# Patient Record
Sex: Male | Born: 1960 | Race: White | Hispanic: No | Marital: Married | Smoking: Former smoker
Health system: Southern US, Community
[De-identification: ages and names within clinical notes are randomized; demographics above are authoritative.]

## PROBLEM LIST (undated history)

## (undated) DIAGNOSIS — I5189 Other ill-defined heart diseases: Secondary | ICD-10-CM

## (undated) DIAGNOSIS — E785 Hyperlipidemia, unspecified: Secondary | ICD-10-CM

## (undated) DIAGNOSIS — Z720 Tobacco use: Secondary | ICD-10-CM

## (undated) DIAGNOSIS — I10 Essential (primary) hypertension: Secondary | ICD-10-CM

## (undated) DIAGNOSIS — I2510 Atherosclerotic heart disease of native coronary artery without angina pectoris: Secondary | ICD-10-CM

## (undated) HISTORY — DX: Hyperlipidemia, unspecified: E78.5

## (undated) HISTORY — DX: Tobacco use: Z72.0

## (undated) HISTORY — DX: Other ill-defined heart diseases: I51.89

## (undated) HISTORY — PX: HERNIA REPAIR: SHX51

## (undated) HISTORY — DX: Atherosclerotic heart disease of native coronary artery without angina pectoris: I25.10

---

## 2009-05-30 ENCOUNTER — Emergency Department: Payer: Self-pay | Admitting: Emergency Medicine

## 2009-07-10 ENCOUNTER — Ambulatory Visit: Payer: Self-pay | Admitting: Surgery

## 2009-07-12 ENCOUNTER — Ambulatory Visit: Payer: Self-pay | Admitting: Surgery

## 2011-05-18 ENCOUNTER — Emergency Department: Payer: Self-pay | Admitting: Emergency Medicine

## 2014-10-17 DIAGNOSIS — E669 Obesity, unspecified: Secondary | ICD-10-CM | POA: Insufficient documentation

## 2014-10-17 DIAGNOSIS — Z833 Family history of diabetes mellitus: Secondary | ICD-10-CM | POA: Insufficient documentation

## 2014-10-17 DIAGNOSIS — Z6379 Other stressful life events affecting family and household: Secondary | ICD-10-CM | POA: Insufficient documentation

## 2014-10-17 DIAGNOSIS — F17200 Nicotine dependence, unspecified, uncomplicated: Secondary | ICD-10-CM | POA: Insufficient documentation

## 2014-10-17 DIAGNOSIS — I10 Essential (primary) hypertension: Secondary | ICD-10-CM | POA: Insufficient documentation

## 2014-10-24 DIAGNOSIS — R7302 Impaired glucose tolerance (oral): Secondary | ICD-10-CM | POA: Insufficient documentation

## 2015-09-23 ENCOUNTER — Encounter: Payer: Self-pay | Admitting: Emergency Medicine

## 2015-09-23 ENCOUNTER — Emergency Department: Payer: Medicaid Other

## 2015-09-23 ENCOUNTER — Inpatient Hospital Stay (HOSPITAL_COMMUNITY)
Admission: EM | Admit: 2015-09-23 | Discharge: 2015-09-24 | DRG: 247 | Disposition: A | Discharge status: Home / Self Care | Payer: Medicaid Other | Source: Other Acute Inpatient Hospital | Attending: Cardiovascular Disease | Admitting: Cardiovascular Disease

## 2015-09-23 ENCOUNTER — Encounter (HOSPITAL_COMMUNITY): Payer: Self-pay | Admitting: *Deleted

## 2015-09-23 ENCOUNTER — Encounter (HOSPITAL_COMMUNITY)
Admission: EM | Disposition: A | Discharge status: Home / Self Care | Payer: Self-pay | Source: Other Acute Inpatient Hospital | Attending: Cardiovascular Disease

## 2015-09-23 ENCOUNTER — Emergency Department
Admission: EM | Admit: 2015-09-23 | Discharge: 2015-09-23 | Disposition: A | Discharge status: Other Acute Inpatient Hospital | Payer: Medicaid Other | Attending: Emergency Medicine | Admitting: Emergency Medicine

## 2015-09-23 DIAGNOSIS — E876 Hypokalemia: Secondary | ICD-10-CM | POA: Diagnosis present

## 2015-09-23 DIAGNOSIS — I10 Essential (primary) hypertension: Secondary | ICD-10-CM | POA: Insufficient documentation

## 2015-09-23 DIAGNOSIS — I2119 ST elevation (STEMI) myocardial infarction involving other coronary artery of inferior wall: Secondary | ICD-10-CM | POA: Insufficient documentation

## 2015-09-23 DIAGNOSIS — Z720 Tobacco use: Secondary | ICD-10-CM

## 2015-09-23 DIAGNOSIS — F17200 Nicotine dependence, unspecified, uncomplicated: Secondary | ICD-10-CM | POA: Diagnosis not present

## 2015-09-23 DIAGNOSIS — R079 Chest pain, unspecified: Secondary | ICD-10-CM | POA: Diagnosis not present

## 2015-09-23 DIAGNOSIS — I2510 Atherosclerotic heart disease of native coronary artery without angina pectoris: Secondary | ICD-10-CM | POA: Diagnosis present

## 2015-09-23 DIAGNOSIS — Z955 Presence of coronary angioplasty implant and graft: Secondary | ICD-10-CM

## 2015-09-23 DIAGNOSIS — I119 Hypertensive heart disease without heart failure: Secondary | ICD-10-CM | POA: Diagnosis present

## 2015-09-23 DIAGNOSIS — I2111 ST elevation (STEMI) myocardial infarction involving right coronary artery: Principal | ICD-10-CM | POA: Diagnosis present

## 2015-09-23 DIAGNOSIS — E785 Hyperlipidemia, unspecified: Secondary | ICD-10-CM | POA: Diagnosis present

## 2015-09-23 DIAGNOSIS — Z79899 Other long term (current) drug therapy: Secondary | ICD-10-CM | POA: Diagnosis not present

## 2015-09-23 DIAGNOSIS — F17290 Nicotine dependence, other tobacco product, uncomplicated: Secondary | ICD-10-CM | POA: Diagnosis present

## 2015-09-23 DIAGNOSIS — R0602 Shortness of breath: Secondary | ICD-10-CM | POA: Diagnosis present

## 2015-09-23 DIAGNOSIS — I252 Old myocardial infarction: Secondary | ICD-10-CM | POA: Diagnosis not present

## 2015-09-23 DIAGNOSIS — E669 Obesity, unspecified: Secondary | ICD-10-CM | POA: Diagnosis present

## 2015-09-23 DIAGNOSIS — Z6835 Body mass index (BMI) 35.0-35.9, adult: Secondary | ICD-10-CM

## 2015-09-23 DIAGNOSIS — I2109 ST elevation (STEMI) myocardial infarction involving other coronary artery of anterior wall: Secondary | ICD-10-CM | POA: Insufficient documentation

## 2015-09-23 HISTORY — PX: CARDIAC CATHETERIZATION: SHX172

## 2015-09-23 HISTORY — DX: Essential (primary) hypertension: I10

## 2015-09-23 LAB — CBC
HCT: 42.9 % (ref 39.0–52.0)
HEMATOCRIT: 44.1 % (ref 40.0–52.0)
HEMOGLOBIN: 15.2 g/dL (ref 13.0–18.0)
HEMOGLOBIN: 14.2 g/dL (ref 13.0–17.0)
MCH: 28.8 pg (ref 26.0–34.0)
MCH: 28.7 pg (ref 26.0–34.0)
MCHC: 34.5 g/dL (ref 32.0–36.0)
MCHC: 33.1 g/dL (ref 30.0–36.0)
MCV: 83.6 fL (ref 80.0–100.0)
MCV: 86.8 fL (ref 78.0–100.0)
Platelets: 241 10*3/uL (ref 150–440)
Platelets: 214 10*3/uL (ref 150–400)
RBC: 5.28 MIL/uL (ref 4.40–5.90)
RBC: 4.94 MIL/uL (ref 4.22–5.81)
RDW: 13.3 % (ref 11.5–14.5)
RDW: 13.3 % (ref 11.5–15.5)
WBC: 11.3 10*3/uL — AB (ref 3.8–10.6)
WBC: 11.5 10*3/uL — ABNORMAL HIGH (ref 4.0–10.5)

## 2015-09-23 LAB — BASIC METABOLIC PANEL
ANION GAP: 8 (ref 5–15)
BUN: 17 mg/dL (ref 6–20)
CALCIUM: 8.6 mg/dL — AB (ref 8.9–10.3)
CO2: 21 mmol/L — AB (ref 22–32)
Chloride: 109 mmol/L (ref 101–111)
Creatinine, Ser: 1.04 mg/dL (ref 0.61–1.24)
GFR calc Af Amer: >60 mL/min (ref >60)
GLUCOSE: 160 mg/dL — AB (ref 65–99)
Potassium: 4 mmol/L (ref 3.5–5.1)
Sodium: 138 mmol/L (ref 135–145)

## 2015-09-23 LAB — COMPREHENSIVE METABOLIC PANEL
ALBUMIN: 3.7 g/dL (ref 3.5–5.0)
ALK PHOS: 72 U/L (ref 38–126)
ALT: 11 U/L — AB (ref 17–63)
AST: 21 U/L (ref 15–41)
Anion gap: 5 (ref 5–15)
BILIRUBIN TOTAL: 0.5 mg/dL (ref 0.3–1.2)
BUN: 20 mg/dL (ref 6–20)
CALCIUM: 8.5 mg/dL — AB (ref 8.9–10.3)
CO2: 27 mmol/L (ref 22–32)
CREATININE: 1.11 mg/dL (ref 0.61–1.24)
Chloride: 108 mmol/L (ref 101–111)
GFR calc Af Amer: >60 mL/min (ref >60)
GFR calc non Af Amer: >60 mL/min (ref >60)
GLUCOSE: 172 mg/dL — AB (ref 65–99)
Potassium: 3.2 mmol/L — ABNORMAL LOW (ref 3.5–5.1)
Sodium: 140 mmol/L (ref 135–145)
TOTAL PROTEIN: 6.8 g/dL (ref 6.5–8.1)

## 2015-09-23 LAB — PROTIME-INR
INR: 1.04
Prothrombin Time: 13.8 seconds (ref 11.4–15.0)

## 2015-09-23 LAB — MRSA PCR SCREENING: MRSA BY PCR: NEGATIVE

## 2015-09-23 LAB — POCT ACTIVATED CLOTTING TIME
ACTIVATED CLOTTING TIME: 147 s
Activated Clotting Time: 368 seconds

## 2015-09-23 LAB — HEPATIC FUNCTION PANEL
ALBUMIN: 3.3 g/dL — AB (ref 3.5–5.0)
ALK PHOS: 62 U/L (ref 38–126)
ALT: 13 U/L — ABNORMAL LOW (ref 17–63)
AST: 30 U/L (ref 15–41)
BILIRUBIN INDIRECT: 0.5 mg/dL (ref 0.3–0.9)
BILIRUBIN TOTAL: 0.6 mg/dL (ref 0.3–1.2)
Bilirubin, Direct: 0.1 mg/dL (ref 0.1–0.5)
TOTAL PROTEIN: 6 g/dL — AB (ref 6.5–8.1)

## 2015-09-23 LAB — APTT: aPTT: 26 seconds (ref 24–36)

## 2015-09-23 LAB — TROPONIN I: Troponin I: 1.08 ng/mL (ref <0.03)

## 2015-09-23 LAB — TSH: TSH: 0.768 u[IU]/mL (ref 0.350–4.500)

## 2015-09-23 SURGERY — LEFT HEART CATH AND CORONARY ANGIOGRAPHY
Anesthesia: LOCAL

## 2015-09-23 MED ORDER — HEPARIN (PORCINE) IN NACL 2-0.9 UNIT/ML-% IJ SOLN
INTRAMUSCULAR | Status: AC
  Filled 2015-09-23: qty 1000

## 2015-09-23 MED ORDER — HEPARIN SODIUM (PORCINE) 5000 UNIT/ML IJ SOLN
60.0000 [IU]/kg | Freq: Once | INTRAMUSCULAR | Status: AC
  Administered 2015-09-23: 03:00:00 via INTRAVENOUS

## 2015-09-23 MED ORDER — SODIUM CHLORIDE 0.9 % IV SOLN
INTRAVENOUS | Status: DC | PRN
  Administered 2015-09-23: 10 mL/h via INTRAVENOUS

## 2015-09-23 MED ORDER — POTASSIUM CHLORIDE ER 10 MEQ PO TBCR
20.0000 meq | EXTENDED_RELEASE_TABLET | Freq: Once | ORAL | Status: AC
  Administered 2015-09-23: 20 meq via ORAL
  Filled 2015-09-23 (×2): qty 2

## 2015-09-23 MED ORDER — LISINOPRIL 10 MG PO TABS
10.0000 mg | ORAL_TABLET | Freq: Every day | ORAL | Status: DC
  Administered 2015-09-23: 10 mg via ORAL
  Filled 2015-09-23: qty 1

## 2015-09-23 MED ORDER — TICAGRELOR 90 MG PO TABS
180.0000 mg | ORAL_TABLET | Freq: Once | ORAL | Status: AC
  Administered 2015-09-23: 180 mg via ORAL
  Filled 2015-09-23: qty 2

## 2015-09-23 MED ORDER — HEPARIN (PORCINE) IN NACL 2-0.9 UNIT/ML-% IJ SOLN
INTRAMUSCULAR | Status: DC | PRN
  Administered 2015-09-23: 1000 mL

## 2015-09-23 MED ORDER — HEPARIN SODIUM (PORCINE) 5000 UNIT/ML IJ SOLN: 60.0000 [IU]/kg | Freq: Once | INTRAMUSCULAR | Status: DC

## 2015-09-23 MED ORDER — ASPIRIN 81 MG PO CHEW
81.0000 mg | CHEWABLE_TABLET | Freq: Every day | ORAL | Status: DC
  Administered 2015-09-23 – 2015-09-24 (×2): 81 mg via ORAL
  Filled 2015-09-23 (×2): qty 1

## 2015-09-23 MED ORDER — ONDANSETRON HCL 4 MG/2ML IJ SOLN: 4.0000 mg | Freq: Four times a day (QID) | INTRAMUSCULAR | Status: DC | PRN

## 2015-09-23 MED ORDER — VERAPAMIL HCL 2.5 MG/ML IV SOLN
INTRAVENOUS | Status: AC
  Filled 2015-09-23: qty 2

## 2015-09-23 MED ORDER — IOPAMIDOL (ISOVUE-370) INJECTION 76%
INTRAVENOUS | Status: DC | PRN
  Administered 2015-09-23: 175 mL via INTRA_ARTERIAL

## 2015-09-23 MED ORDER — MIDAZOLAM HCL 2 MG/2ML IJ SOLN
INTRAMUSCULAR | Status: DC | PRN
  Administered 2015-09-23: 1 mg via INTRAVENOUS

## 2015-09-23 MED ORDER — VERAPAMIL HCL 2.5 MG/ML IV SOLN
INTRAVENOUS | Status: DC | PRN
  Administered 2015-09-23: 10 mL via INTRA_ARTERIAL

## 2015-09-23 MED ORDER — ACETAMINOPHEN 325 MG PO TABS: 650.0000 mg | ORAL_TABLET | ORAL | Status: DC | PRN

## 2015-09-23 MED ORDER — NITROGLYCERIN 1 MG/10 ML FOR IR/CATH LAB
INTRA_ARTERIAL | Status: DC | PRN
  Administered 2015-09-23 (×2): 200 ug via INTRACORONARY

## 2015-09-23 MED ORDER — SODIUM CHLORIDE 0.9 % IV SOLN
250.0000 mL | INTRAVENOUS | Status: DC | PRN
Start: 2015-09-23 — End: 2015-09-24

## 2015-09-23 MED ORDER — NITROGLYCERIN 1 MG/10 ML FOR IR/CATH LAB
INTRA_ARTERIAL | Status: AC
  Filled 2015-09-23: qty 10

## 2015-09-23 MED ORDER — IOPAMIDOL (ISOVUE-370) INJECTION 76%
INTRAVENOUS | Status: AC
  Filled 2015-09-23: qty 125

## 2015-09-23 MED ORDER — SODIUM CHLORIDE 0.9% FLUSH
3.0000 mL | INTRAVENOUS | Status: DC | PRN
Start: 2015-09-23 — End: 2015-09-24

## 2015-09-23 MED ORDER — TICAGRELOR 90 MG PO TABS
90.0000 mg | ORAL_TABLET | Freq: Two times a day (BID) | ORAL | Status: DC
  Administered 2015-09-23 – 2015-09-24 (×2): 90 mg via ORAL
  Filled 2015-09-23 (×2): qty 1

## 2015-09-23 MED ORDER — ENOXAPARIN SODIUM 40 MG/0.4ML ~~LOC~~ SOLN
40.0000 mg | SUBCUTANEOUS | Status: DC
  Administered 2015-09-23: 40 mg via SUBCUTANEOUS
  Filled 2015-09-23: qty 0.4

## 2015-09-23 MED ORDER — SODIUM CHLORIDE 0.9% FLUSH
3.0000 mL | Freq: Two times a day (BID) | INTRAVENOUS | Status: DC
  Administered 2015-09-23 – 2015-09-24 (×4): 3 mL via INTRAVENOUS

## 2015-09-23 MED ORDER — HEPARIN SODIUM (PORCINE) 1000 UNIT/ML IJ SOLN
INTRAMUSCULAR | Status: AC
  Filled 2015-09-23: qty 1

## 2015-09-23 MED ORDER — HEPARIN SODIUM (PORCINE) 1000 UNIT/ML IJ SOLN
INTRAMUSCULAR | Status: DC | PRN
  Administered 2015-09-23 (×2): 4000 [IU] via INTRAVENOUS

## 2015-09-23 MED ORDER — LIDOCAINE HCL (PF) 1 % IJ SOLN
INTRAMUSCULAR | Status: DC | PRN
  Administered 2015-09-23: 2 mL via INTRADERMAL

## 2015-09-23 MED ORDER — CARVEDILOL 3.125 MG PO TABS
6.2500 mg | ORAL_TABLET | Freq: Two times a day (BID) | ORAL | Status: DC
  Administered 2015-09-23 (×2): 6.25 mg via ORAL
  Filled 2015-09-23 (×2): qty 2

## 2015-09-23 MED ORDER — ATORVASTATIN CALCIUM 80 MG PO TABS
80.0000 mg | ORAL_TABLET | Freq: Every day | ORAL | Status: DC
  Administered 2015-09-23: 80 mg via ORAL
  Filled 2015-09-23: qty 1

## 2015-09-23 MED ORDER — LIDOCAINE HCL (PF) 1 % IJ SOLN
INTRAMUSCULAR | Status: AC
  Filled 2015-09-23: qty 30

## 2015-09-23 MED ORDER — SODIUM CHLORIDE 0.9 % IV SOLN
INTRAVENOUS | Status: AC
  Administered 2015-09-23: 05:00:00 via INTRAVENOUS

## 2015-09-23 MED ORDER — ASPIRIN 81 MG PO CHEW
324.0000 mg | CHEWABLE_TABLET | Freq: Once | ORAL | Status: AC
  Administered 2015-09-23: 324 mg via ORAL

## 2015-09-23 MED ORDER — IOPAMIDOL (ISOVUE-370) INJECTION 76%
INTRAVENOUS | Status: AC
  Filled 2015-09-23: qty 100

## 2015-09-23 SURGICAL SUPPLY — 19 items
BALLN EMERGE MR 2.5X12 (BALLOONS) ×2
BALLN ~~LOC~~ EMERGE MR 3.5X20 (BALLOONS) ×2
BALLOON EMERGE MR 2.5X12 (BALLOONS) ×1 IMPLANT
BALLOON ~~LOC~~ EMERGE MR 3.5X20 (BALLOONS) ×1 IMPLANT
CATH HEARTRAIL 6F IL3.5 (CATHETERS) ×2 IMPLANT
CATH INFINITI 5FR ANG PIGTAIL (CATHETERS) ×2 IMPLANT
CATH VISTA GUIDE 6FR JR4 (CATHETERS) ×2 IMPLANT
DEVICE RAD COMP TR BAND LRG (VASCULAR PRODUCTS) ×2 IMPLANT
ELECT DEFIB PAD ADLT CADENCE (PAD) ×2 IMPLANT
GLIDESHEATH SLEND SS 6F .021 (SHEATH) ×2 IMPLANT
KIT ENCORE 26 ADVANTAGE (KITS) ×2 IMPLANT
KIT HEART LEFT (KITS) ×2 IMPLANT
PACK CARDIAC CATHETERIZATION (CUSTOM PROCEDURE TRAY) ×2 IMPLANT
STENT XIENCE ALPINE RX 2.75X38 (Permanent Stent) ×2 IMPLANT
SYR MEDRAD MARK V 150ML (SYRINGE) ×2 IMPLANT
TRANSDUCER W/STOPCOCK (MISCELLANEOUS) ×2 IMPLANT
TUBING CIL FLEX 10 FLL-RA (TUBING) ×2 IMPLANT
WIRE RUNTHROUGH .014X180CM (WIRE) ×2 IMPLANT
WIRE SAFE-T 1.5MM-J .035X260CM (WIRE) ×2 IMPLANT

## 2015-09-23 NOTE — ED Notes (Signed)
Pt brought to triage via wheelchair. Pt reports approximately 2 hours ago he was awakened from sleep with cp.

## 2015-09-23 NOTE — Progress Notes (Signed)
PATIENT ID: [id number] with hypertension and tobacco abuse here with STEMI.  S/p DES to proximal and mid RCA.  SUBJECTIVE:  Feeling well.  Denies chest pain.     PHYSICAL EXAM Filed Vitals:   09/23/15 0645 09/23/15 0650 09/23/15 0700 09/23/15 0715  BP: 145/118  173/99 162/105  Pulse: 65 73 74 65  Temp:      TempSrc:      Resp: 16 14 16 14   Height:      Weight:      SpO2: 96% 96% 98% 98%   General:  Obese.   Neck: No JVD Lungs:  CTAB.  No crackles, rhonchi or wheezes Heart:  RRR.  No m/r/g.  Normal S1/S2 Abdomen:  Soft, NT, ND.  +BS Extremities:  WWP  No edema   LABS: Lab Results  Component Value Date   TROPONINI 1.08* 09/23/2015   Results for orders placed or performed during the hospital encounter of 09/23/15 (from the past 24 hour(s))  POCT Activated clotting time     Status: None   Collection Time: 09/23/15  3:54 AM  Result Value Ref Range   Activated Clotting Time 147 seconds  POCT Activated clotting time     Status: None   Collection Time: 09/23/15  4:06 AM  Result Value Ref Range   Activated Clotting Time 368 seconds  MRSA PCR Screening     Status: None   Collection Time: 09/23/15  4:54 AM  Result Value Ref Range   MRSA by PCR NEGATIVE NEGATIVE  CBC     Status: Abnormal   Collection Time: 09/23/15  5:23 AM  Result Value Ref Range   WBC 11.5 (H) 4.0 - 10.5 K/uL   RBC 4.94 4.22 - 5.81 MIL/uL   Hemoglobin 14.2 13.0 - 17.0 g/dL   HCT 44.042.9 34.739.0 - 42.552.0 %   MCV 86.8 78.0 - 100.0 fL   MCH 28.7 26.0 - 34.0 pg   MCHC 33.1 30.0 - 36.0 g/dL   RDW 95.613.3 38.711.5 - 56.415.5 %   Platelets 214 150 - 400 K/uL    Intake/Output Summary (Last 24 hours) at 09/23/15 0804 Last data filed at 09/23/15 0600  Gross per 24 hour  Intake 103.33 ml  Output    600 ml  Net -496.67 ml    Telemetry:  PVCs.  NSVT up to 4 beats.   LHC 09/23/15:  Ost 1st Mrg lesion, 40% stenosed.  Prox LAD lesion, 40% stenosed.  Ost 1st Diag to 1st Diag lesion, 60% stenosed.  Prox RCA to Mid RCA lesion,  100% stenosed. Post intervention, there is a 0% residual stenosis.  The left ventricular systolic function is normal.  1. Severe one-vessel coronary artery disease with thrombotic occlusion of the proximal right coronary artery (codominant vessel). There is mild to moderate disease affecting the LAD and left circumflex. 2. Normal LV systolic function with mild inferior wall hypokinesis. 3. Successful angioplasty and drug-eluting stent placement to the proximal and mid right coronary artery in a very long segment.  Wall Motion                 Left Heart    Left Ventricle The left ventricular size is normal. The left ventricular systolic function is normal. The ejection fraction could not be assess due to 55-60. There are wall motion abnormalities in the left ventricle. There are segmental wall motion abnormalities in the left ventricle.   Mitral Valve There is no mitral valve regurgitation.   Aortic Valve There  is no aortic valve stenosis.    Coronary Diagrams    Diagnostic Diagram          ASSESSMENT AND PLAN:  Active Problems:   ST elevation (STEMI) myocardial infarction involving other coronary artery of inferior wall (HCC)   ST elevation myocardial infarction (STEMI) of inferior wall (HCC)   # CAD s/p inferior STEMI:  Mr. Frankowski had two DES placed in the RCA.  He has VERY poor insight into what happened and how important it will be to commit to lifestyle changes.  He reports that, "I only eat 1-2 meals per day.  Now I can split my whole hamburger into 1/2 now and the other half later."  Also reports that he's going to go home and finish smoking the vape that he already bought.  He is not interested in cardiac rehab because he already has a gym membership.  He needs continued education and encouragement.  Continue aspirin and ticagrelor for at least 1 year.  Carvedilol and atorvastatin started this admission.  Echo pending.   # Hypertension: Blood pressure is  poorly-controlled.  He was not on any medications at home and will start carvedilol 6.25mg  bid and lisinopril 10 mg daily today.   # Hyperlipidemia: Patient was started on atorvastatin 80 mg.    # Tobacco abuse: Patient states that he quit smoking a week ago but is not willing to stop using a vape.  Needs continued encouragement.   # Obesity: Recommended cardiac rehab and lifestyle interventions.  He is minimally interested in trying.   Toshiko Kemler C. Duke Salvia, MD, [facility] 09/23/2015 8:04 AM

## 2015-09-23 NOTE — ED Notes (Signed)
MD at bedside. 

## 2015-09-23 NOTE — H&P (Signed)
Primary Cardiologist: N/A  No primary care provider on file.  Chief Complaint: Chest Pain  HPI: Bryan Whitney is a 55 year old male with history of tobacco abuse and hypertension, no known CAD, presenting as a code STEMI from [facility] ED.  He awoke acutely from sleep with sweats and some shortness of breath. He may have also noted a separate episode of chest pain a couple days ago that was much more mild.  At the outside ED, ECG at 0242 showed ST elevations triage with an ST segment elevation in leads II, III, aVF, V3, V4 and V5.  Code STEMI was then activated.  Patient received 4 325 mg ASA, heparin ACS, and Brillinta load.  BP elevated and pain controlled with SL NTG and NTG paste was placed prior to transfer.    Upon arrival to cath lab, patient hypertensive (559R systolic) but otherwise stable with residual mild pain.    Past Medical History  Diagnosis Date  . Hypertension     Past Surgical History  Procedure Laterality Date  . Hernia repair      No family history on file. Social History:  reports that he has been smoking.  He does not have any smokeless tobacco history on file. He reports that he drinks alcohol. His drug history is not on file.  Allergies: No Known Allergies  Outside medications: 1 BP medications (he is unsure of which)  Results for orders placed or performed during the hospital encounter of 09/23/15 (from the past 48 hour(s))  CBC     Status: Abnormal   Collection Time: 09/23/15  2:37 AM  Result Value Ref Range   WBC 11.3 (H) 3.8 - 10.6 K/uL   RBC 5.28 4.40 - 5.90 MIL/uL   Hemoglobin 15.2 13.0 - 18.0 g/dL   HCT 44.1 40.0 - 52.0 %   MCV 83.6 80.0 - 100.0 fL   MCH 28.8 26.0 - 34.0 pg   MCHC 34.5 32.0 - 36.0 g/dL   RDW 13.3 11.5 - 14.5 %   Platelets 241 150 - 440 K/uL  Comprehensive metabolic panel     Status: Abnormal   Collection Time: 09/23/15  2:37 AM  Result Value Ref Range   Sodium 140 135 - 145 mmol/L   Potassium 3.2 (L) 3.5 - 5.1 mmol/L   Chloride 108 101 - 111 mmol/L   CO2 27 22 - 32 mmol/L   Glucose, Bld 172 (H) 65 - 99 mg/dL   BUN 20 6 - 20 mg/dL   Creatinine, Ser 1.11 0.61 - 1.24 mg/dL   Calcium 8.5 (L) 8.9 - 10.3 mg/dL   Total Protein 6.8 6.5 - 8.1 g/dL   Albumin 3.7 3.5 - 5.0 g/dL   AST 21 15 - 41 U/L   ALT 11 (L) 17 - 63 U/L   Alkaline Phosphatase 72 38 - 126 U/L   Total Bilirubin 0.5 0.3 - 1.2 mg/dL   GFR calc non Af Amer >60 >60 mL/min   GFR calc Af Amer >60 >60 mL/min    Comment: (NOTE) The eGFR has been calculated using the CKD EPI equation. This calculation has not been validated in all clinical situations. eGFR's persistently <60 mL/min signify possible Chronic Kidney Disease.    Anion gap 5 5 - 15  Troponin I     Status: Abnormal   Collection Time: 09/23/15  2:37 AM  Result Value Ref Range   Troponin I 1.08 (HH) <0.03 ng/mL    Comment: CRITICAL RESULT CALLED TO,  READ BACK BY AND VERIFIED WITH DAWN [facility] AT 6144 09/23/15.PMH  APTT     Status: None   Collection Time: 09/23/15  2:37 AM  Result Value Ref Range   aPTT 26 24 - 36 seconds  Protime-INR     Status: None   Collection Time: 09/23/15  2:37 AM  Result Value Ref Range   Prothrombin Time 13.8 11.4 - 15.0 seconds   INR 1.04    Dg Chest Portable 1 View  09/23/2015  CLINICAL DATA:  Awoke this morning with chest pain. EXAM: PORTABLE CHEST 1 VIEW COMPARISON:  None. FINDINGS: Mild-to-moderate cardiomegaly. There is mild tortuosity of the thoracic aorta. There is vascular congestion and increased bilateral perihilar densities. Subsegmental atelectasis at the left lung base. No confluent airspace disease, large pleural effusion or pneumothorax. Mild hyperinflation. IMPRESSION: Cardiomegaly and vascular congestion. Increased perihilar opacities may reflect bronchial thickening or pulmonary edema. Electronically Signed   By: Jeb Levering M.D.   On: 09/23/2015 03:04    ROS: as above. Otherwise, review of systems is negative unless per above HPI  Filed  Vitals:   09/23/15 0338  SpO2: 96%  BP 200/90 in cath lab pre-procedure  PE:  General: No acute distress HEENT: Atraumatic, EOMI, mucous membranes moist CV: RRR no murmurs, gallops. No JVD at 45 degrees. No HJR. Respiratory: Clear, no crackles. Normal work of breathing GI: Non-distended and non-tender. No palpable organomegaly.  Extremities: 2+ radial pulses bilaterally. No edema. Neuro/Psych: CN grossly intact, alert and oriented  Xray: IMPRESSION: Cardiomegaly and vascular congestion. Increased perihilar opacities may reflect bronchial thickening or pulmonary edema.  Assessment/Plan 55 year old male with acute onset chest pain with anterior/inferior ST elevations, code STEMI  Chest pain, STEMI - Emergent left heart cath with Dr. Fletcher Anon, further recommendations post-procedure - Patient pre-loaded with 325 ASA and Brillinta, ACS heparin given in transit - Post cath will admit to CCU - TTE - TSH and A1C add-on  HTN - Elevated pre-procedure, will re-assess post catheterization  HLD - Atorva 80 - Lipids in AM  Hypokalemia - 3.2 at outside ED, will replace and re-check  Lolita Cram Means  MD 09/23/2015, 3:58 AM

## 2015-09-23 NOTE — ED Provider Notes (Signed)
[facility] Emergency Department Provider Note   ____________________________________________  Time seen: Approximately 228 AM  I have reviewed the triage vital signs and the nursing notes.   HISTORY  Chief Complaint Chest Pain    HPI Bryan Whitney is a 55 y.o. male who comes into the hospital today with chest pain. He reports that woke him up out of his sleep approximately 2 hours ago. The patient reports that he's never had a heart attack in the past. He's had some sweats and some shortness of breath and he reports that the pain radiates down both of his arms. The patient rates his pain 8 out of 10 in intensity reports that in the middle of his chest. The patient was brought straight back from triage with an ST segment elevation MI diagnosed on EKG.the patient reports that he had similar chest pain earlier this week but it went away on its own.   Past Medical History  Diagnosis Date  . Hypertension     There are no active problems to display for this patient.   Past Surgical History  Procedure Laterality Date  . Hernia repair      No current outpatient prescriptions  Allergies Review of patient's allergies indicates no known allergies.  No family history on file.  Social History Social History  Substance Use Topics  . Smoking status: Current Every Day Smoker  . Smokeless tobacco: Not on file  . Alcohol Use: Yes    Review of Systems Constitutional: No fever/chills Eyes: No visual changes. ENT: No sore throat. Cardiovascular:  chest pain. Respiratory:  shortness of breath. Gastrointestinal: No abdominal pain.  No nausea, no vomiting.  No diarrhea.  No constipation. Genitourinary: Negative for dysuria. Musculoskeletal: Negative for back pain. Skin: Negative for rash. Neurological: Negative for headaches, focal weakness or numbness.  10-point ROS otherwise negative.  ____________________________________________   PHYSICAL  EXAM:  VITAL SIGNS: ED Triage Vitals  Enc Vitals Group     BP 09/23/15  0259 179/107     Pulse 09/23/15  0259 98     Resp 09/23/15  0259 18     Temp --      Temp src --      SpO2 09/23/15  0259 98%     Weight --      Height --      Head Cir --      Peak Flow --      Pain Score 09/23/15 0224 8     Pain Loc --      Pain Edu? --      Excl. in GC? --     Constitutional: Alert and oriented. Well appearing and in moderate distress. Eyes: Conjunctivae are normal. PERRL. EOMI. Head: Atraumatic. Nose: No congestion/rhinnorhea. Mouth/Throat: Mucous membranes are moist.  Oropharynx non-erythematous. Cardiovascular: Normal rate, regular rhythm. Grossly normal heart sounds.  Good peripheral circulation. Respiratory: Normal respiratory effort.  No retractions. Lungs CTAB. Gastrointestinal: Soft and nontender. No distention. Positive bowels sounds Musculoskeletal: No lower extremity tenderness nor edema.   Neurologic:  Normal speech and language.  Skin:  Skin is warm, dry and intact.  Psychiatric: Mood and affect are normal.   ____________________________________________   LABS (all labs ordered are listed, but only abnormal results are displayed)  Labs Reviewed  CBC - Abnormal; Notable for the following:    WBC 11.3 (*)    All other components within normal limits  COMPREHENSIVE METABOLIC PANEL  TROPONIN I  APTT  PROTIME-INR  ____________________________________________  EKG  ED ECG REPORT I, Rebecka Apley, the attending physician, personally viewed and interpreted this ECG.   Date: 09/23/2015  EKG Time: 228  Rate: 83  Rhythm: normal sinus rhythm  Axis: normal  Intervals:none  ST&T Change: ST segment elevation II, III, avf, V3, V4, V5  ED ECG REPORT I, Rebecka Apley, the attending physician, personally viewed and interpreted this ECG.   Date: 09/23/2015  EKG Time: 0242  Rate: 93  Rhythm: normal sinus rhythm  Axis: normal  Intervals:none  ST&T  Change: ST segment elevation in leads 2, 3, aVF as well as V3, V4 and V5, T-wave inversion in lead aVL.   ____________________________________________  RADIOLOGY  CXR: Cardiomegaly and vascular congestion, Increased perihilar opacities may reflect bronchial thickening or pulmonary edema.  ____________________________________________   PROCEDURES  Procedure(s) performed: None  Procedures  Critical Care performed: Yes, see critical care note(s)  CRITICAL CARE Performed by: Lucrezia Europe P   Total critical care time: 30 minutes  Critical care time was exclusive of separately billable procedures and treating other patients.  Critical care was necessary to treat or prevent imminent or life-threatening deterioration.  Critical care was time spent personally by me on the following activities: development of treatment plan with patient and/or surrogate as well as nursing, discussions with consultants, evaluation of patient's response to treatment, examination of patient, obtaining history from patient or surrogate, ordering and performing treatments and interventions, ordering and review of laboratory studies, ordering and review of radiographic studies, pulse oximetry and re-evaluation of patient's condition.  ____________________________________________   INITIAL IMPRESSION / ASSESSMENT AND PLAN / ED COURSE  Pertinent labs & imaging results that were available during my care of the patient were reviewed by me and considered in my medical decision making (see chart for details).  55 year old male who comes into the hospital today with some chest pain. The patient had an EKG out front which confirmed that the patient was having an ST segment elevation MI. The patient did receive 4 aspirin as well as some nitroglycerin. The patient's blood pressure was elevated. I did contact [facility] and spoke to Dr. Kirke Corin. The patient was also given heparin and Ticagrilor. The patient will be  transferred over to [facility] to have a catheterization. The patient still had some chest pain after his nitroglycerin so EMS we'll place a Nitropaste to the patient's chest. ____________________________________________   FINAL CLINICAL IMPRESSION(S) / ED DIAGNOSES  Final diagnoses:  ST elevation myocardial infarction (STEMI) involving other coronary artery of anterior wall (HCC)  ST elevation myocardial infarction (STEMI) involving other coronary artery of inferior wall (HCC)      NEW MEDICATIONS STARTED DURING THIS VISIT:  New Prescriptions   No medications on file     Note:  This document was prepared using Dragon voice recognition software and may include unintentional dictation errors.    Rebecka Apley, MD 09/23/15 [phone]

## 2015-09-23 NOTE — ED Notes (Signed)
Notified Dr Zenda AlpersWebster of critical lab value - Troponin 1.08.

## 2015-09-24 ENCOUNTER — Other Ambulatory Visit: Payer: Self-pay

## 2015-09-24 ENCOUNTER — Encounter (HOSPITAL_COMMUNITY): Payer: Self-pay | Admitting: Cardiovascular Disease

## 2015-09-24 ENCOUNTER — Telehealth: Payer: Self-pay | Admitting: Cardiovascular Disease

## 2015-09-24 ENCOUNTER — Inpatient Hospital Stay (HOSPITAL_COMMUNITY): Payer: Medicaid Other

## 2015-09-24 DIAGNOSIS — Z720 Tobacco use: Secondary | ICD-10-CM | POA: Diagnosis present

## 2015-09-24 DIAGNOSIS — I2510 Atherosclerotic heart disease of native coronary artery without angina pectoris: Secondary | ICD-10-CM

## 2015-09-24 DIAGNOSIS — E785 Hyperlipidemia, unspecified: Secondary | ICD-10-CM | POA: Diagnosis present

## 2015-09-24 DIAGNOSIS — I10 Essential (primary) hypertension: Secondary | ICD-10-CM

## 2015-09-24 DIAGNOSIS — I2119 ST elevation (STEMI) myocardial infarction involving other coronary artery of inferior wall: Secondary | ICD-10-CM

## 2015-09-24 LAB — ECHOCARDIOGRAM COMPLETE
CHL CUP DOP CALC LVOT VTI: 26.9 cm
E decel time: 268 msec
EERAT: 12.08
FS: 47 % — AB (ref 28–44)
HEIGHTINCHES: 68 in
IV/PV OW: 1.09
LA diam index: 1.84 cm/m2
LA vol A4C: 63.4 ml
LASIZE: 42 mm
LAVOL: 58.6 mL
LAVOLIN: 25.7 mL/m2
LDCA: 3.14 cm2
LEFT ATRIUM END SYS DIAM: 42 mm
LV PW d: 15.5 mm — AB (ref 0.6–1.1)
LV SIMPSON'S DISK: 67
LV dias vol: 68 mL (ref 62–150)
LV sys vol index: 10 mL/m2
LVDIAVOLIN: 30 mL/m2
LVEEAVG: 12.08
LVEEMED: 12.08
LVELAT: 7.83 cm/s
LVOT diameter: 20 mm
LVOT peak grad rest: 7 mmHg
LVOTPV: 136 cm/s
LVOTSV: 84 mL
LVSYSVOL: 23 mL (ref 21–61)
MV Dec: 268
MV pk A vel: 97.5 m/s
MV pk E vel: 94.6 m/s
MVPG: 4 mmHg
RV TAPSE: 23 mm
Stroke v: 45 ml
TDI e' lateral: 7.83
TDI e' medial: 5.87
WEIGHTICAEL: 3686.09 [oz_av]

## 2015-09-24 LAB — BASIC METABOLIC PANEL
Anion gap: 3 — ABNORMAL LOW (ref 5–15)
BUN: 10 mg/dL (ref 6–20)
CALCIUM: 8.5 mg/dL — AB (ref 8.9–10.3)
CO2: 29 mmol/L (ref 22–32)
Chloride: 108 mmol/L (ref 101–111)
Creatinine, Ser: 1.11 mg/dL (ref 0.61–1.24)
GFR calc Af Amer: >60 mL/min (ref >60)
Glucose, Bld: 137 mg/dL — ABNORMAL HIGH (ref 65–99)
POTASSIUM: 3.7 mmol/L (ref 3.5–5.1)
Sodium: 140 mmol/L (ref 135–145)

## 2015-09-24 LAB — HEMOGLOBIN A1C
HEMOGLOBIN A1C: 6.3 % — AB (ref 4.8–5.6)
Mean Plasma Glucose: 134 mg/dL

## 2015-09-24 LAB — LIPID PANEL
CHOLESTEROL: 131 mg/dL (ref 0–200)
HDL: 22 mg/dL — ABNORMAL LOW (ref >40)
LDL Cholesterol: 87 mg/dL (ref 0–99)
Total CHOL/HDL Ratio: 6 RATIO
Triglycerides: 112 mg/dL (ref <150)
VLDL: 22 mg/dL (ref 0–40)

## 2015-09-24 MED ORDER — ATORVASTATIN CALCIUM 80 MG PO TABS: 80.0000 mg | ORAL_TABLET | Freq: Every day | ORAL | Status: DC

## 2015-09-24 MED ORDER — CARVEDILOL 12.5 MG PO TABS: 12.5000 mg | ORAL_TABLET | Freq: Two times a day (BID) | ORAL | Status: DC

## 2015-09-24 MED ORDER — CARVEDILOL 12.5 MG PO TABS
12.5000 mg | ORAL_TABLET | Freq: Two times a day (BID) | ORAL | Status: DC
  Administered 2015-09-24: 12.5 mg via ORAL
  Filled 2015-09-24: qty 1

## 2015-09-24 MED ORDER — LISINOPRIL 20 MG PO TABS
20.0000 mg | ORAL_TABLET | Freq: Every day | ORAL | Status: DC
  Administered 2015-09-24: 20 mg via ORAL
  Filled 2015-09-24: qty 1

## 2015-09-24 MED ORDER — ASPIRIN 81 MG PO CHEW: 81.0000 mg | CHEWABLE_TABLET | Freq: Every day | ORAL | Status: DC

## 2015-09-24 MED ORDER — TICAGRELOR 90 MG PO TABS: 90.0000 mg | ORAL_TABLET | Freq: Two times a day (BID) | ORAL | Status: DC

## 2015-09-24 MED ORDER — LISINOPRIL 20 MG PO TABS: 20.0000 mg | ORAL_TABLET | Freq: Every day | ORAL | Status: DC

## 2015-09-24 MED ORDER — NITROGLYCERIN 0.4 MG SL SUBL: 0.4000 mg | SUBLINGUAL_TABLET | SUBLINGUAL | Status: DC | PRN

## 2015-09-24 NOTE — Progress Notes (Signed)
  Echocardiogram 2D Echocardiogram has been performed.  Janalyn HarderWest, Jemal Miskell R 09/24/2015, 9:54 AM

## 2015-09-24 NOTE — Progress Notes (Signed)
PATIENT ID: 65M with hypertension and tobacco abuse here with STEMI.  S/p DES to proximal and mid RCA.  SUBJECTIVE:  Feeling well.  Denies chest pain. Wants to go home to care for his 56 year old son.  PHYSICAL EXAM Filed Vitals:   09/24/15 0300 09/24/15 0400 09/24/15 0600 09/24/15 0730  BP:  161/103 141/79   Pulse: 52 69 55 80  Temp:  98.7 F (37.1 C)    TempSrc:  Oral    Resp: Height:      Weight:      SpO2: 96% 92% 95% 96%   General:  Obese.   Neck: No JVD Lungs:  CTAB.  No crackles, rhonchi or wheezes Heart:  RRR.  No m/r/g.  Normal S1/S2 Abdomen:  Soft, NT, ND.  +BS Extremities:  WWP  No edema   LABS: Lab Results  Component Value Date   TROPONINI 1.08* 09/23/2015   Results for orders placed or performed during the hospital encounter of 09/23/15 (from the past 24 hour(s))  Basic metabolic panel     Status: Abnormal   Collection Time: 09/24/15  4:05 AM  Result Value Ref Range   Sodium 140 135 - 145 mmol/L   Potassium 3.7 3.5 - 5.1 mmol/L   Chloride 108 101 - 111 mmol/L   CO2 29 22 - 32 mmol/L   Glucose, Bld 137 (H) 65 - 99 mg/dL   BUN 10 6 - 20 mg/dL   Creatinine, Ser 6.04 0.61 - 1.24 mg/dL   Calcium 8.5 (L) 8.9 - 10.3 mg/dL   GFR calc non Af Amer >60 >60 mL/min   GFR calc Af Amer >60 >60 mL/min   Anion gap 3 (L) 5 - 15  Lipid panel     Status: Abnormal   Collection Time: 09/24/15  4:05 AM  Result Value Ref Range   Cholesterol 131 0 - 200 mg/dL   Triglycerides 540 <981 mg/dL   HDL 22 (L) >19 mg/dL   Total CHOL/HDL Ratio 6.0 RATIO   VLDL 22 0 - 40 mg/dL   LDL Cholesterol 87 0 - 99 mg/dL    Intake/Output Summary (Last 24 hours) at 09/24/15 0810 Last data filed at 09/24/15 0730  Gross per 24 hour  Intake   1240 ml  Output   2175 ml  Net   -935 ml    Telemetry:  PVCs.  NSVT up to 4 beats.   LHC 09/23/15:  Ost 1st Mrg lesion, 40% stenosed.  Prox LAD lesion, 40% stenosed.  Ost 1st Diag to 1st Diag lesion, 60% stenosed.  Prox RCA to  Mid RCA lesion, 100% stenosed. Post intervention, there is a 0% residual stenosis.  The left ventricular systolic function is normal.  1. Severe one-vessel coronary artery disease with thrombotic occlusion of the proximal right coronary artery (codominant vessel). There is mild to moderate disease affecting the LAD and left circumflex. 2. Normal LV systolic function with mild inferior wall hypokinesis. 3. Successful angioplasty and drug-eluting stent placement to the proximal and mid right coronary artery in a very long segment.  Wall Motion                 Left Heart    Left Ventricle The left ventricular size is normal. The left ventricular systolic function is normal. The ejection fraction could not be assess due to 55-60. There are wall motion abnormalities in the left ventricle. There are segmental wall motion abnormalities in the left ventricle.  Mitral Valve There is no mitral valve regurgitation.   Aortic Valve There is no aortic valve stenosis.    Coronary Diagrams    Diagnostic Diagram          ASSESSMENT AND PLAN:  Active Problems:   ST elevation (STEMI) myocardial infarction involving other coronary artery of inferior wall (HCC)   ST elevation myocardial infarction (STEMI) of inferior wall (HCC)   # CAD s/p inferior STEMI:  [name] had two DES placed in the RCA.  He has VERY poor insight into what happened and how important it will be to commit to lifestyle changes.  He reports that, "I only eat 1-2 meals per day.  Now I can split my whole hamburger into 1/2 now and the other half later."  Also reports that he's going to go home and finish smoking the vape that he already bought.  He is not interested in cardiac rehab because he already has a gym membership.  He needs continued education and encouragement.  Continue aspirin and ticagrelor for at least 1 year.  Carvedilol and atorvastatin started this admission.  Echo today.  # Hypertension: Blood pressure  is poorly-controlled.  He was not on any medications at home. Increase carvedilol to 12.5mg  bid. Increase lisinopril to 20 mg daily today.   # Hyperlipidemia: Patient was started on atorvastatin 80 mg.    # Tobacco abuse: Patient states that he quit smoking a week ago but is not willing to stop using a vape.  Needs continued encouragement.   # Obesity: Recommended cardiac rehab and lifestyle interventions.  He doesn't have interest today - belongs to a gym which he says he will do some walking in.  Plan echo this morning. Will increase BP medicine this am as well. Ok for discharge this afternoon. Follow-up with Dr. Kirke [place]Arida in [place].  Chrystie NoseKenneth C. Amorita Vanrossum, MD, [employer]ACC Attending Cardiologist [facility]  09/24/2015 8:10 AM

## 2015-09-24 NOTE — Care Management Note (Signed)
Case Management Note  Patient Details  Name: Bryan Whitney MRN: [id number] Date of Birth: 1960/06/28  Subjective/Objective:      Adm w mi            Action/Plan:lives w fam  Expected Discharge Date:                  Expected Discharge Plan:  Home/Self Care  In-House Referral:     Discharge planning Services  CM Consult, Medication Assistance  Post Acute Care Choice:    Choice offered to:     DME Arranged:    DME Agency:     HH Arranged:    HH Agency:     Status of Service:     If discussed at [employer] Meetings, dates discussed:    Additional Comments: gave pt 30day free brilinta card. Has medicaid.  Hanley Haysowell, Kenneth Lax T, RN 09/24/2015, 9:22 AM

## 2015-09-24 NOTE — Telephone Encounter (Signed)
Dr. Kirke CorinARida  7/18  3:30    S/w pt son who gave pt number as [phone]. [facility]. Will call TCM tomorrow

## 2015-09-24 NOTE — Progress Notes (Signed)
CARDIAC REHAB PHASE I   PRE:  Rate/Rhythm: 75 SR c/ PVCs  BP:  Sitting: 147/99        SaO2: 95 RA  MODE:  Ambulation: 700 ft   POST:  Rate/Rhythm: 106 SR c/ PVCs  BP:  Sitting: 171/99         SaO2: 93 RA  Pt ambulated 700 ft on RA, handheld assist, steady gait, tolerated well with no complaints. Pt very eager to leave the hospital. Completed MI/stent education.  Reviewed risk factors, tobacco cessation (pt states he will think about quitting, declines fake cigarette), MI book, anti-platelet therapy, stent card, activity restrictions, ntg, exercise, heart healthy diet,  portion control, sodium restrictions, and phase 2 cardiac rehab. Pt verbalized understanding, however, pt also verbalized limited receptiveness to education/willingness to change habits/lifestyle. Pt agrees to phase 2 cardiac rehab referral, will [facility] to [facility] [place] [place] request, however, pt states he will not likely attend. Pt to see case manager regarding brilinta prior to discharge. Pt to bed for echo after walk, call bell within reach.   [phone] Bryan GrapesEmily C Gavrielle Streck, RN, BSN 09/24/2015 9:21 AM

## 2015-09-24 NOTE — Discharge Instructions (Signed)
No driving for 24 hours. No lifting over 5 lbs for 1 week. No sexual activity for 1 week. Keep procedure site clean & dry. If you notice increased pain, swelling, bleeding or pus, call/return!  You may shower, but no soaking baths/hot tubs/pools for 1 week.  ° ° °

## 2015-09-24 NOTE — Discharge Summary (Signed)
Discharge Summary    Patient ID: Bryan Purlhillip Mikel,  MRN: [id number], DOB/AGE: 03/31/1960 55 y.o.  Admit date: 09/23/2015 Discharge date: 09/24/2015  Primary Care Provider: No primary care provider on file. Primary Cardiologist: Dr. Kirke CorinArida  Discharge Diagnoses    Principal Problem:   ST elevation (STEMI) myocardial infarction involving other coronary artery of inferior wall [facility](HCC) Active Problems:   Benign essential HTN   Dyslipidemia   Tobacco abuse   CAD (coronary artery disease)   Allergies No Known Allergies  Diagnostic Studies/Procedures    Cath 09/23/2015 Conclusion     Ost 1st Mrg lesion, 40% stenosed.  Prox LAD lesion, 40% stenosed.  Ost 1st Diag to 1st Diag lesion, 60% stenosed.  Prox RCA to Mid RCA lesion, 100% stenosed. Post intervention, there is a 0% residual stenosis.  The left ventricular systolic function is normal.  1. Severe one-vessel coronary artery disease with thrombotic occlusion of the proximal right coronary artery (codominant vessel). There is mild to moderate disease affecting the LAD and left circumflex. 2. Normal LV systolic function with mild inferior wall hypokinesis. 3. Successful angioplasty and drug-eluting stent placement to the proximal and mid right coronary artery in a very long segment.  Recommendations: The patient was initially thought of having anterior myocardial infarction with inferior changes due to wraparound LAD. However, the LAD had no significant obstructive disease. The infarction was inferior due to proximal RCA occlusion. The anterior ST changes probably reflect RV involvement. Recommend dual antiplatelet therapy for at least one year. Aggressive treatment of risk factors and blood pressure control.    Echo 09/24/2015 LV EF: 60% - 65%  ------------------------------------------------------------------- Indications: MI - inferior wall - acute  410.41.  ------------------------------------------------------------------- History: PMH: NSTEMI. Patient is post stent placement. Risk factors: Hypertension.  ------------------------------------------------------------------- Study Conclusions  - Left ventricle: The cavity size was normal. Wall thickness was  increased in a pattern of moderate LVH. Systolic function was  normal. The estimated ejection fraction was in the range of 60%  to 65%. There is mild hypokinesis of the basal-midinferior  myocardium. Doppler parameters are consistent with abnormal left  ventricular relaxation (grade 1 diastolic dysfunction). - Mitral valve: Calcified annulus. - Left atrium: The atrium was mildly dilated.  _____________   History of Present Illness     Bryan Whitney is a 55 year old male with history of tobacco abuse and hypertension, no known CAD, presenting as a code STEMI from [facility] ED. He awoke acutely from sleep with sweats and some shortness of breath. He may have also noted a separate episode of chest pain a couple days ago that was much more mild. At the outside ED, ECG at 0242 showed ST elevations triage with an ST segment elevation in leads II, III, aVF, V3, V4 and V5. Code STEMI was then activated. Patient received 4 325 mg ASA, heparin ACS, and Brillinta load. BP elevated and pain controlled with SL NTG and NTG paste was placed prior to transfer.   Upon arrival to cath lab, patient hypertensive (200s systolic) but otherwise stable with residual mild pain.   Hospital Course     He underwent emergent cardiac catheterization in the morning of 09/23/2015 which showed 100% occluded proximal to mid RCA treated with 2.75 x 38 mm Xience DES, 40% ostial OM1 stenosis, 40% proximal LAD stenosis, 60% ostial D1 stenosis, EF 55-60%. Postprocedure, he was placed on aspirin, Brilinta, carvedilol, Lipitor, and lisinopril. He has recently trying to quit smoking and currently doing  vaping, we  have discussed with the patient and recommended stopping vaping as soon as possible as well. Echocardiogram obtained on 09/24/2015 showed EF 60-65%, mild hypokinesis of the basal mid inferior myocardium, grade 1 diastolic dysfunction. He ambulated in the morning of 09/24/2015 without difficulty. Cardiac rehabilitation was initially sent to [facility] office, however patient states he likely will not attend and did not seems to be interested in cardiac rehabilitation. He was eager to be discharged, due to stable echocardiogram, he was eventually discharged on 09/24/2015.  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 He says we are destroying his life by not allowing him to go back to work. He also says he is not going to listen to our recommendations any more including taking Brilinta which i have repeatedly advised against. Dr. Rennis Golden has also discussed with the patient, from cardiology service standpoint, we continued to recommend him to hold off on driving until he can be seen by Dr. Kirke Corin next week. Repeated emphasis has been placed on compliance with dual antiplatelet therapy, and I have warned him that if he does not take Brilinta, he may be back with a recurrent heart attack.  _____________  Discharge Vitals Blood pressure 147/99, pulse 72, temperature 97.6 F (36.4 C), temperature source Axillary, resp. rate 20, height  (1.727 m), weight 230 lb 6.1 oz (104.5 kg), SpO2 94 %.  Filed Weights   09/23/15 0453  Weight: 230 lb 6.1 oz (104.5 kg)    Labs & Radiologic Studies     CBC  Recent Labs  09/23/15 0237 09/23/15 0523  WBC 11.3* 11.5*  HGB 15.2 14.2  HCT 44.1 42.9  MCV 83.6 86.8  PLT 241 214   Basic Metabolic Panel  Recent Labs  09/23/15 0523 09/24/15 0405  NA 138  140  K 4.0 3.7  CL 109 108  CO2 21* 29  GLUCOSE 160* 137*  BUN 17 10  CREATININE 1.04 1.11  CALCIUM 8.6* 8.5*   Liver Function Tests  Recent Labs  09/23/15 0237 09/23/15 0523  AST 21 30  ALT 11* 13*  ALKPHOS 72 62  BILITOT 0.5 0.6  PROT 6.8 6.0*  ALBUMIN 3.7 3.3*   Cardiac Enzymes  Recent Labs  09/23/15 0237  TROPONINI 1.08*   Hemoglobin A1C  Recent Labs  09/23/15 0523  HGBA1C 6.3*   Fasting Lipid Panel  Recent Labs  09/24/15 0405  CHOL 131  HDL 22*  LDLCALC 87  TRIG 914  CHOLHDL 6.0   Thyroid Function Tests  Recent Labs  09/23/15 0523  TSH 0.768    Dg Chest Portable 1 View  09/23/2015  CLINICAL DATA:  Awoke this morning with chest pain. EXAM: PORTABLE CHEST 1 VIEW COMPARISON:  None. FINDINGS: Mild-to-moderate cardiomegaly. There is mild tortuosity of the thoracic aorta. There is vascular congestion and increased bilateral perihilar densities. Subsegmental atelectasis at the left lung base. No confluent airspace disease, large pleural effusion or pneumothorax. Mild hyperinflation. IMPRESSION: Cardiomegaly and vascular congestion. Increased perihilar opacities may reflect bronchial thickening or pulmonary edema. Electronically Signed   By: Rubye Oaks M.D.   On: 09/23/2015 03:04    Disposition   Pt is being discharged home today in good condition.  Follow-up Plans & Appointments    Follow-up Information  Follow up with Lorine Bears, MD On 10/02/2015.   Specialty:  Cardiology   Why:  3:30PM. Cardiology followup.   Contact information:   [address] [place] [address] [phone]      Discharge Instructions    AMB Referral to Cardiac Rehabilitation - Phase II    Complete by:  As directed   Diagnosis:   STEMI PTCA       Amb Referral to Cardiac Rehabilitation    Complete by:  As directed   Diagnosis:   STEMI Coronary Stents       Diet - low sodium heart healthy    Complete by:  As directed      Increase  activity slowly    Complete by:  As directed            Discharge Medications   Current Discharge Medication List    START taking these medications   Details  aspirin 81 MG chewable tablet Chew 1 tablet (81 mg total) by mouth daily.    atorvastatin (LIPITOR) 80 MG tablet Take 1 tablet (80 mg total) by mouth daily at 6 PM. Qty: 90 tablet, Refills: 3    carvedilol (COREG) 12.5 MG tablet Take 1 tablet (12.5 mg total) by mouth 2 (two) times daily with a meal. Qty: 60 tablet, Refills: 11    nitroGLYCERIN (NITROSTAT) 0.4 MG SL tablet Place 1 tablet (0.4 mg total) under the tongue every 5 (five) minutes as needed. Qty: 25 tablet, Refills: 3    ticagrelor (BRILINTA) 90 MG TABS tablet Take 1 tablet (90 mg total) by mouth 2 (two) times daily. Qty: 180 tablet, Refills: 3      CONTINUE these medications which have CHANGED   Details  lisinopril (PRINIVIL,ZESTRIL) 20 MG tablet Take 1 tablet (20 mg total) by mouth daily. Qty: 90 tablet, Refills: 3         Aspirin prescribed at discharge?  Yes High Intensity Statin Prescribed? (Lipitor 40-80mg  or Crestor 20-40mg ): Yes Beta Blocker Prescribed? Yes For EF 40% or less, Was ACEI/ARB Prescribed? Yes, EF normal, ACEI used for high BP ADP Receptor Inhibitor Prescribed? (i.e. Plavix etc.-Includes Medically Managed Patients): Yes For EF <40%, Aldosterone Inhibitor Prescribed? No: EF normal Was EF assessed during THIS hospitalization? Yes Was Cardiac Rehab II ordered? (Included Medically managed Patients): Yes   Outstanding Labs/Studies   None  Duration of Discharge Encounter   Greater than 30 minutes including physician time.  Signed, Azalee Course PA-C 09/24/2015, 2:43 PM

## 2015-09-25 NOTE — Telephone Encounter (Signed)
Attempted TCM call. Left message on pt VM to call back.

## 2015-09-27 NOTE — Telephone Encounter (Signed)
Patient contacted regarding [facility] on 09/24/15.  Patient understands to follow up with provider Kirke CorinArida on July 18 at 3:30pm [facility]. Patient understands discharge instructions? yes Patient understands medications and regiment? yes Patient understands to bring all medications to this visit? yes

## 2015-10-02 ENCOUNTER — Ambulatory Visit (INDEPENDENT_AMBULATORY_CARE_PROVIDER_SITE_OTHER): Payer: Medicaid Other | Admitting: Cardiovascular Disease

## 2015-10-02 ENCOUNTER — Encounter: Payer: Self-pay | Admitting: Cardiovascular Disease

## 2015-10-02 DIAGNOSIS — Z720 Tobacco use: Secondary | ICD-10-CM | POA: Diagnosis not present

## 2015-10-02 DIAGNOSIS — I2119 ST elevation (STEMI) myocardial infarction involving other coronary artery of inferior wall: Secondary | ICD-10-CM

## 2015-10-02 DIAGNOSIS — E785 Hyperlipidemia, unspecified: Secondary | ICD-10-CM | POA: Diagnosis not present

## 2015-10-02 DIAGNOSIS — I10 Essential (primary) hypertension: Secondary | ICD-10-CM

## 2015-10-02 MED ORDER — CARVEDILOL 3.125 MG PO TABS: 3.1250 mg | ORAL_TABLET | Freq: Two times a day (BID) | ORAL | Status: DC

## 2015-10-02 NOTE — Progress Notes (Signed)
Cardiology Office Note   Date:  10/02/2015   ID:  Bryan Whitney, DOB [birth year], MRN [id number]  PCP:  No primary care provider on file.  Cardiologist:   Lorine Bears, MD   Chief Complaint  Patient presents with  . Follow-up    pt did not want EKG      History of Present Illness: Bryan Whitney is a 55 y.o. male who presents for A follow-up visit after recent hospitalization for inferior ST elevation myocardial infarction. He presented in early this month with acute onset of chest pain and was found to have ST elevation on his EKG. He underwent emergent cardiac catheterization which showed occluded proximal right coronary artery with mild disease affecting the LAD and OM1. He underwent successful angioplasty and drug-eluting stent placement to the right coronary artery without complications. Ejection fraction was normal. He has been doing well since then with no recurrent chest pain or shortness of breath. He has been taking all his medications regularly except carvedilol as he could not get the prescription. He resumed work already with no significant limitations. He continues to smoke but he is trying to quit. He doesn't think he can attend cardiac rehabilitation.    Past Medical History  Diagnosis Date  . Hypertension     Past Surgical History  Procedure Laterality Date  . Hernia repair    . Cardiac catheterization N/A 09/23/2015    Procedure: Left Heart Cath and Coronary Angiography;  Surgeon: Iran Ouch, MD;  Location: [facility] INVASIVE CV LAB;  Service: Cardiovascular;  Laterality: N/A;  . Cardiac catheterization N/A 09/23/2015    Procedure: Coronary Stent Intervention;  Surgeon: Iran Ouch, MD;  Location: [facility] INVASIVE CV LAB;  Service: Cardiovascular;  Laterality: N/A;     Current Outpatient Prescriptions  Medication Sig Dispense Refill  . aspirin 81 MG chewable tablet Chew 1 tablet (81 mg total) by mouth daily.    Marland Kitchen atorvastatin (LIPITOR) 80 MG tablet Take 1  tablet (80 mg total) by mouth daily at 6 PM. 90 tablet 3  . lisinopril (PRINIVIL,ZESTRIL) 20 MG tablet Take 1 tablet (20 mg total) by mouth daily. 90 tablet 3  . nitroGLYCERIN (NITROSTAT) 0.4 MG SL tablet Place 1 tablet (0.4 mg total) under the tongue every 5 (five) minutes as needed. 25 tablet 3  . ticagrelor (BRILINTA) 90 MG TABS tablet Take 1 tablet (90 mg total) by mouth 2 (two) times daily. 180 tablet 3  . carvedilol (COREG) 12.5 MG tablet Take 1 tablet (12.5 mg total) by mouth 2 (two) times daily with a meal. (Patient not taking: Reported on 10/02/2015) 60 tablet 11   No current facility-administered medications for this visit.    Allergies:   Review of patient's allergies indicates no known allergies.    Social History:  The patient  reports that he has been smoking Cigarettes.  He has a 50 pack-year smoking history. He does not have any smokeless tobacco history on file. He reports that he drinks about 0.6 oz of alcohol per week.   Family History:  The patient's family history is negative for coronary artery disease.   ROS:  Please see the history of present illness.   Otherwise, review of systems are positive for none.   All other systems are reviewed and negative.    PHYSICAL EXAM: VS:  BP 110/70 mmHg  Pulse 71  Ht  (1.727 m)  Wt 228 lb (103.42 kg)  BMI 34.68 kg/m2  SpO2 94% , BMI  Body mass index is 34.68 kg/(m^2). GEN: Well nourished, well developed, in no acute distress HEENT: normal Neck: no JVD, carotid bruits, or masses Cardiac: RRR; no murmurs, rubs, or gallops,no edema  Respiratory:  clear to auscultation bilaterally, normal work of breathing GI: soft, nontender, nondistended, + BS MS: no deformity or atrophy Skin: warm and dry, no rash Neuro:  Strength and sensation are intact Psych: euthymic mood, full affect Right radial pulse is normal with no hematoma  EKG:  EKG is not ordered today.    Recent Labs: 09/23/2015: ALT 13*; Hemoglobin 14.2; Platelets  214; TSH 0.768 09/24/2015: BUN 10; Creatinine, Ser 1.11; Potassium 3.7; Sodium 140    Lipid Panel    Component Value Date/Time   CHOL 131 09/24/2015 0405   TRIG 112 09/24/2015 0405   HDL 22* 09/24/2015 0405   CHOLHDL 6.0 09/24/2015 0405   VLDL 22 09/24/2015 0405   LDLCALC 87 09/24/2015 0405      Wt Readings from Last 3 Encounters:  10/02/15 228 lb (103.42 kg)  09/23/15 230 lb 6.1 oz (104.5 kg)         ASSESSMENT AND PLAN:  1.  Status post recent inferior ST elevation myocardial infarction: He is doing well overall with no recurrent angina. I strongly advised him to attend cardiac rehabilitation and he was already referred. However, he does not think he can attend due to his work and he wants to join the gym on his own. Continue dual antiplatelet therapy for at least one year. I had a prolonged discussion with him about the importance of lifestyle changes. The patient works as a Naval architecttruck driver. I explained to him that according to [facility] guidelines, he has to be off for 2 months and he has to pass a stress test with a workload above 6 METs.  2. Essential hypertension: Blood pressure is controlled. He has been taking lisinopril but could not get the prescription for carvedilol. He does not take the higher dose of carvedilol and I instructed him to decrease the dose to 3.125 mg twice daily. His ejection fraction was normal.  3. Hyperlipidemia: Continue high dose atorvastatin. I requested fasting lipid and liver profile in one month.  4. Tobacco use: I strongly advised him to quit smoking.    Disposition:   FU with me in 3 months  Signed,  Lorine BearsMuhammad Arida, MD  10/02/2015 4:25 PM    [facility]

## 2015-10-02 NOTE — Patient Instructions (Addendum)
Medication Instructions:  Your physician has recommended you make the following change in your medication:  DECREASE coreg to 3.125mg  twice daily   Labwork: Fasting lipid and liver profile in one month. Nothing to eat or drink after midnight the evening before your labs.   Testing/Procedures: none  Follow-Up: Your physician recommends that you schedule a follow-up appointment in: 3 months with Bryan Whitney.    Any Other Special Instructions Will Be Listed Below (If Applicable).     If you need a refill on your cardiac medications before your next appointment, please call your pharmacy.  Steps to Quit Smoking  Smoking tobacco can be harmful to your health and can affect almost every organ in your body. Smoking puts you, and those around you, at risk for developing many serious chronic diseases. Quitting smoking is difficult, but it is one of the best things that you can do for your health. It is never too late to quit. WHAT ARE THE BENEFITS OF QUITTING SMOKING? When you quit smoking, you lower your risk of developing serious diseases and conditions, such as:  Lung cancer or lung disease, such as COPD.  Heart disease.  Stroke.  Heart attack.  Infertility.  Osteoporosis and bone fractures. Additionally, symptoms such as coughing, wheezing, and shortness of breath may get better when you quit. You may also find that you get sick less often because your body is stronger at fighting off colds and infections. If you are pregnant, quitting smoking can help to reduce your chances of having a baby of low birth weight. HOW DO I GET READY TO QUIT? When you decide to quit smoking, create a plan to make sure that you are successful. Before you quit:  Pick a date to quit. Set a date within the next two weeks to give you time to prepare.  Write down the reasons why you are quitting. Keep this list in places where you will see it often, such as on your bathroom mirror or in your car or  wallet.  Identify the people, places, things, and activities that make you want to smoke (triggers) and avoid them. Make sure to take these actions:  Throw away all cigarettes at home, at work, and in your car.  Throw away smoking accessories, such as Set designerashtrays and lighters.  Clean your car and make sure to empty the ashtray.  Clean your home, including curtains and carpets.  Tell your family, friends, and coworkers that you are quitting. Support from your loved ones can make quitting easier.  Talk with your health care provider about your options for quitting smoking.  Find out what treatment options are covered by your health insurance. WHAT STRATEGIES CAN I USE TO QUIT SMOKING?  Talk with your healthcare provider about different strategies to quit smoking. Some strategies include:  Quitting smoking altogether instead of gradually lessening how much you smoke over a period of time. Research shows that quitting "cold [place]key" is more successful than gradually quitting.  Attending in-person counseling to help you build problem-solving skills. You are more likely to have success in quitting if you attend several counseling sessions. Even short sessions of 10 minutes can be effective.  Finding resources and support systems that can help you to quit smoking and remain smoke-free after you quit. These resources are most helpful when you use them often. They can include:  Online chats with a Veterinary surgeoncounselor.  Telephone quitlines.  Printed Materials engineerself-help materials.  Support groups or group counseling.  Text messaging programs.  Mobile phone applications.  Taking medicines to help you quit smoking. (If you are pregnant or breastfeeding, talk with your health care provider first.) Some medicines contain nicotine and some do not. Both types of medicines help with cravings, but the medicines that include nicotine help to relieve withdrawal symptoms. Your health care provider may recommend:  Nicotine  patches, gum, or lozenges.  Nicotine inhalers or sprays.  Non-nicotine medicine that is taken by mouth. Talk with your health care provider about combining strategies, such as taking medicines while you are also receiving in-person counseling. Using these two strategies together makes you more likely to succeed in quitting than if you used either strategy on its own. If you are pregnant or breastfeeding, talk with your health care provider about finding counseling or other support strategies to quit smoking. Do not take medicine to help you quit smoking unless told to do so by your health care provider. WHAT THINGS CAN I DO TO MAKE IT EASIER TO QUIT? Quitting smoking might feel overwhelming at first, but there is a lot that you can do to make it easier. Take these important actions:  Reach out to your family and friends and ask that they support and encourage you during this time. Call telephone quitlines, reach out to support groups, or work with a counselor for support.  Ask people who smoke to avoid smoking around you.  Avoid places that trigger you to smoke, such as bars, parties, or smoke-break areas at work.  Spend time around people who do not smoke.  Lessen stress in your life, because stress can be a smoking trigger for some people. To lessen stress, try:  Exercising regularly.  Deep-breathing exercises.  Yoga.  Meditating.  Performing a body scan. This involves closing your eyes, scanning your body from head to toe, and noticing which parts of your body are particularly tense. Purposefully relax the muscles in those areas.  Download or purchase mobile phone or tablet apps (applications) that can help you stick to your quit plan by providing reminders, tips, and encouragement. There are many free apps, such as QuitGuide from the [employer] developer for Disease Control and Prevention). You can find other support for quitting smoking (smoking cessation) through smokefree.gov and other  websites. HOW WILL I FEEL WHEN I QUIT SMOKING? Within the first 24 hours of quitting smoking, you may start to feel some withdrawal symptoms. These symptoms are usually most noticeable 2-3 days after quitting, but they usually do not last beyond 2-3 weeks. Changes or symptoms that you might experience include:  Mood swings.  Restlessness, anxiety, or irritation.  Difficulty concentrating.  Dizziness.  Strong cravings for sugary foods in addition to nicotine.  Mild weight gain.  Constipation.  Nausea.  Coughing or a sore throat.  Changes in how your medicines work in your body.  A depressed mood.  Difficulty sleeping (insomnia). After the first 2-3 weeks of quitting, you may start to notice more positive results, such as:  Improved sense of smell and taste.  Decreased coughing and sore throat.  Slower heart rate.  Lower blood pressure.  Clearer skin.  The ability to breathe more easily.  Fewer sick days. Quitting smoking is very challenging for most people. Do not get discouraged if you are not successful the first time. Some people need to make many attempts to quit before they achieve long-term success. Do your best to stick to your quit plan, and talk with your health care provider if you have any questions or  concerns.   This information is not intended to replace advice given to you by your health care provider. Make sure you discuss any questions you have with your health care provider.   Document Released: 02/25/2001 Document Revised: 07/18/2014 Document Reviewed: 07/18/2014 Elsevier Interactive Patient Education [employer]

## 2015-11-01 ENCOUNTER — Other Ambulatory Visit: Payer: Medicaid Other

## 2015-11-06 ENCOUNTER — Other Ambulatory Visit (INDEPENDENT_AMBULATORY_CARE_PROVIDER_SITE_OTHER): Payer: Medicaid Other

## 2015-11-06 DIAGNOSIS — E785 Hyperlipidemia, unspecified: Secondary | ICD-10-CM

## 2015-11-07 LAB — HEPATIC FUNCTION PANEL
ALT: 11 IU/L (ref 0–44)
AST: 14 IU/L (ref 0–40)
Albumin: 3.8 g/dL (ref 3.5–5.5)
Alkaline Phosphatase: 73 IU/L (ref 39–117)
BILIRUBIN TOTAL: 0.5 mg/dL (ref 0.0–1.2)
BILIRUBIN, DIRECT: 0.15 mg/dL (ref 0.00–0.40)
Total Protein: 6.2 g/dL (ref 6.0–8.5)

## 2015-11-07 LAB — LIPID PANEL
CHOL/HDL RATIO: 3.1 ratio (ref 0.0–5.0)
CHOLESTEROL TOTAL: 107 mg/dL (ref 100–199)
HDL: 34 mg/dL — ABNORMAL LOW (ref >39)
LDL Calculated: 62 mg/dL (ref 0–99)
TRIGLYCERIDES: 53 mg/dL (ref 0–149)
VLDL Cholesterol Cal: 11 mg/dL (ref 5–40)

## 2015-11-08 ENCOUNTER — Telehealth: Payer: Self-pay | Admitting: Cardiovascular Disease

## 2015-11-08 NOTE — Telephone Encounter (Signed)
Pharmacy calling stating they need clarification on Carvedilol  They have to orders for  1.12.5 mg 1 twice a day 2. 3.125 mg 1 twice a day  Please call back

## 2015-11-08 NOTE — Telephone Encounter (Signed)
Left detailed message on [facility]. Provided call back number if questions.

## 2015-12-27 ENCOUNTER — Telehealth: Payer: Self-pay | Admitting: Cardiovascular Disease

## 2015-12-27 ENCOUNTER — Other Ambulatory Visit: Payer: Self-pay

## 2015-12-27 ENCOUNTER — Encounter: Payer: Self-pay | Admitting: Cardiovascular Disease

## 2015-12-27 ENCOUNTER — Ambulatory Visit (INDEPENDENT_AMBULATORY_CARE_PROVIDER_SITE_OTHER): Payer: Medicaid Other | Admitting: Cardiovascular Disease

## 2015-12-27 VITALS — BP 158/78 | HR 84 | Ht 68.0 in | Wt 240.5 lb

## 2015-12-27 DIAGNOSIS — E7800 Pure hypercholesterolemia, unspecified: Secondary | ICD-10-CM

## 2015-12-27 DIAGNOSIS — I2510 Atherosclerotic heart disease of native coronary artery without angina pectoris: Secondary | ICD-10-CM

## 2015-12-27 DIAGNOSIS — I10 Essential (primary) hypertension: Secondary | ICD-10-CM

## 2015-12-27 DIAGNOSIS — Z720 Tobacco use: Secondary | ICD-10-CM

## 2015-12-27 MED ORDER — AMLODIPINE BESYLATE 5 MG PO TABS: 5.0000 mg | ORAL_TABLET | Freq: Every day | ORAL | 3 refills | Status: DC

## 2015-12-27 MED ORDER — ATORVASTATIN CALCIUM 80 MG PO TABS: 80.0000 mg | ORAL_TABLET | Freq: Every day | ORAL | 3 refills | Status: DC

## 2015-12-27 MED ORDER — CLOPIDOGREL BISULFATE 75 MG PO TABS: 75.0000 mg | ORAL_TABLET | Freq: Every day | ORAL | 3 refills | Status: DC

## 2015-12-27 NOTE — Patient Instructions (Signed)
Medication Instructions:  Your physician has recommended you make the following change in your medication:  STOP taking coreg STOP taking brilinta START taking plavix 75mg  once daily START taking amlodipine 5mg  once daily RESUME atorvastatin 80mg  once daily   Labwork: none  Testing/Procedures: none  Follow-Up: Your physician recommends that you schedule a follow-up appointment in: 3 months with Dr. Kirke CorinArida.    Any Other Special Instructions Will Be Listed Below (If Applicable).     If you need a refill on your cardiac medications before your next appointment, please call your pharmacy.

## 2015-12-27 NOTE — Progress Notes (Signed)
Cardiology Office Note   Date:  12/27/2015   ID:  Bryan Whitney, DOB [birth year], MRN [id number]  PCP:  No primary care provider on file.  Cardiologist:   Bryan Bears, MD   Chief Complaint  Patient presents with  . other    3 month follow up. Meds reviewed by the pt. verbally. Pt. c/o a medication causing him to feel "loopy."       History of Present Illness: Bryan Whitney is a 55 y.o. male who presents for a follow-up visit regarding coronary artery disease. He had inferior ST elevation myocardial infarction in July 2017.Cardiac catheterization  showed occluded proximal right coronary artery with mild disease affecting the LAD and OM1. He underwent successful angioplasty and drug-eluting stent placement to the right coronary artery without complications. Ejection fraction was normal.  Unfortunately, he stopped taking Brilinta, carvedilol and atorvastatin without informing us. He said that something was making him feel very short of breath and he decided to stop these medications on his own. Since he stopped taking these medications, he reports resolution of shortness of breath. He denies any chest pain and he is feeling well overall. He stopped cigarette smoking and switched to electronic cigarettes.   Past Medical History:  Diagnosis Date  . Coronary artery disease    Inferior ST elevation myocardial infarction in July 2017. Cardiac catheterization showed occluded proximal right coronary artery with 40% disease in proximal LAD and OM1. Successful angioplasty and drug-eluting stent placement to the right coronary artery. Ejection fraction was normal.  . Hyperlipidemia   . Hypertension   . Tobacco use     Past Surgical History:  Procedure Laterality Date  . CARDIAC CATHETERIZATION N/A 09/23/2015   Procedure: Left Heart Cath and Coronary Angiography;  Surgeon: Iran Ouch, MD;  [facility]: [facility];  Service: Cardiovascular;  Laterality: N/A;  . CARDIAC  CATHETERIZATION N/A 09/23/2015   Procedure: Coronary Stent Intervention;  Surgeon: Iran Ouch, MD;  Location: [facility];  Service: Cardiovascular;  Laterality: N/A;  . HERNIA REPAIR       Current Outpatient Prescriptions  Medication Sig Dispense Refill  . aspirin 81 MG chewable tablet Chew 1 tablet (81 mg total) by mouth daily.    Marland Kitchen lisinopril (PRINIVIL,ZESTRIL) 20 MG tablet Take 1 tablet (20 mg total) by mouth daily. 90 tablet 3  . nitroGLYCERIN (NITROSTAT) 0.4 MG SL tablet Place 1 tablet (0.4 mg total) under the tongue every 5 (five) minutes as needed. 25 tablet 3   No current facility-administered medications for this visit.     Allergies:   Review of patient's allergies indicates no known allergies.    Social History:  The patient  reports that he has been smoking Cigarettes and E-cigarettes.  He has a 50.00 pack-year smoking history. He has never used smokeless tobacco. He reports that he drinks about 0.6 oz of alcohol per week .   Family History:  The patient's family history is negative for coronary artery disease.   ROS:  Please see the history of present illness.   Otherwise, review of systems are positive for none.   All other systems are reviewed and negative.    PHYSICAL EXAM: VS:  BP (!) 158/78 (BP Location: Left Arm, Patient Position: Sitting, Cuff Size: Large)   Pulse 84   Ht 5\' 8"  (1.727 m)   Wt 240 lb 8 oz (109.1 kg)   BMI 36.57 kg/m  , BMI Body mass index is 36.57 kg/m. GEN:  Well nourished, well developed, in no acute distress  HEENT: normal  Neck: no JVD, carotid bruits, or masses Cardiac: RRR; no murmurs, rubs, or gallops,no edema  Respiratory:  clear to auscultation bilaterally, normal work of breathing GI: soft, nontender, nondistended, + BS MS: no deformity or atrophy  Skin: warm and dry, no rash Neuro:  Strength and sensation are intact Psych: euthymic mood, full affect   EKG:  EKG is not ordered today.    Recent Labs: 09/23/2015:  Hemoglobin 14.2; Platelets 214; TSH 0.768 09/24/2015: BUN 10; Creatinine, Ser 1.11; Potassium 3.7; Sodium 140 11/06/2015: ALT 11    Lipid Panel    Component Value Date/Time   CHOL 107 11/06/2015 0950   TRIG 53 11/06/2015 0950   HDL 34 (L) 11/06/2015 0950   CHOLHDL 3.1 11/06/2015 0950   CHOLHDL 6.0 09/24/2015 0405   VLDL 22 09/24/2015 0405   LDLCALC 62 11/06/2015 0950      Wt Readings from Last 3 Encounters:  12/27/15 240 lb 8 oz (109.1 kg)  10/02/15 228 lb (103.4 kg)  09/23/15 230 lb 6.1 oz (104.5 kg)         ASSESSMENT AND PLAN:  1.  Coronary artery disease involving native coronary arteries without angina:  I had a prolonged discussion with him about the importance of not stopping his medications without discussing with us. I explained to him the risk of stent thrombosis without dual antiplatelet therapy. I suspect that his dyspnea was likely related to Brilinta and possibly carvedilol. Thus, I elected to switch him to Plavix 75 mg once daily. Continue lifelong aspirin.  2. Essential hypertension:  Blood pressure is not controlled and  he has not been taking carvedilol. He is only taking lisinopril. I elected to add amlodipine 5 mg once daily.   3. Hyperlipidemia: Lipid profile was optimal in August with an LDL of 62. I asked him to resume taking atorvastatin as  4. Tobacco use: He is using electronic cigarettes.    Disposition:   FU with me in 3 months  Signed,  Bryan BearsMuhammad Jabez Molner, MD  12/27/2015 3:12 PM    [facility]

## 2015-12-27 NOTE — Telephone Encounter (Signed)
Pt [facility]. [facility] to d/c medication orders. [facility] [address], [place]

## 2016-01-03 ENCOUNTER — Ambulatory Visit: Payer: Medicaid Other | Admitting: Cardiovascular Disease

## 2016-03-25 ENCOUNTER — Encounter: Payer: Self-pay | Admitting: Cardiovascular Disease

## 2016-03-25 ENCOUNTER — Ambulatory Visit (INDEPENDENT_AMBULATORY_CARE_PROVIDER_SITE_OTHER): Payer: Medicaid Other | Admitting: Cardiovascular Disease

## 2016-03-25 VITALS — BP 140/98 | Ht 68.0 in | Wt 231.5 lb

## 2016-03-25 DIAGNOSIS — I10 Essential (primary) hypertension: Secondary | ICD-10-CM

## 2016-03-25 DIAGNOSIS — I2510 Atherosclerotic heart disease of native coronary artery without angina pectoris: Secondary | ICD-10-CM

## 2016-03-25 DIAGNOSIS — Z720 Tobacco use: Secondary | ICD-10-CM | POA: Diagnosis not present

## 2016-03-25 DIAGNOSIS — E785 Hyperlipidemia, unspecified: Secondary | ICD-10-CM | POA: Diagnosis not present

## 2016-03-25 MED ORDER — LISINOPRIL 40 MG PO TABS: 40.0000 mg | ORAL_TABLET | Freq: Every day | ORAL | 5 refills | Status: DC

## 2016-03-25 NOTE — Patient Instructions (Signed)
Medication Instructions:  Your physician has recommended you make the following change in your medication:  INCREASE lisinopril to 40mg once daily    Labwork: none  Testing/Procedures: none  Follow-Up: Your physician wants you to follow-up in: 6 months with Dr. Arida.  You will receive a reminder letter in the mail two months in advance. If you don't receive a letter, please call our office to schedule the follow-up appointment.   Any Other Special Instructions Will Be Listed Below (If Applicable).     If you need a refill on your cardiac medications before your next appointment, please call your pharmacy.   

## 2016-03-25 NOTE — Progress Notes (Signed)
Cardiology Office Note   Date:  03/25/2016   ID:  Bryan Whitney, DOB [birth year], MRN [id number]  PCP:  No primary care provider on file.  Cardiologist:   Lorine BearsMuhammad Arida, MD   Chief Complaint  Patient presents with  . other    3 month follow up. Meds reviewed by the pt. verbally. "doing well."       History of Present Illness: Bryan Whitney is a 56 y.o. male who presents for a follow-up visit regarding coronary artery disease. He had inferior ST elevation myocardial infarction in July 2017.Cardiac catheterization  showed occluded proximal right coronary artery with mild disease affecting the LAD and OM1. He underwent successful angioplasty and drug-eluting stent placement to the right coronary artery without complications. Ejection fraction was normal. He stopped cigarette smoking and switched to electronic cigarettes. He has been doing well and denies any chest pain. He has stable exertional dyspnea with no orthopnea or PND. He has been taking his medications regularly.    Past Medical History:  Diagnosis Date  . Coronary artery disease    Inferior ST elevation myocardial infarction in July 2017. Cardiac catheterization showed occluded proximal right coronary artery with 40% disease in proximal LAD and OM1. Successful angioplasty and drug-eluting stent placement to the right coronary artery. Ejection fraction was normal.  . Hyperlipidemia   . Hypertension   . Tobacco use     Past Surgical History:  Procedure Laterality Date  . CARDIAC CATHETERIZATION N/A 09/23/2015   Procedure: Left Heart Cath and Coronary Angiography;  Surgeon: Iran OuchMuhammad A Arida, MD;  [facility];  Service: Cardiovascular;  Laterality: N/A;  . CARDIAC CATHETERIZATION N/A 09/23/2015   Procedure: Coronary Stent Intervention;  Surgeon: Iran OuchMuhammad A Arida, MD;  Location: [facility];  Service: Cardiovascular;  Laterality: N/A;  . HERNIA REPAIR       Current Outpatient Prescriptions    Medication Sig Dispense Refill  . amLODipine (NORVASC) 5 MG tablet Take 1 tablet (5 mg total) by mouth daily. 30 tablet 3  . aspirin 81 MG chewable tablet Chew 1 tablet (81 mg total) by mouth daily.    Marland Kitchen. atorvastatin (LIPITOR) 80 MG tablet Take 1 tablet (80 mg total) by mouth daily. 30 tablet 3  . clopidogrel (PLAVIX) 75 MG tablet Take 1 tablet (75 mg total) by mouth daily. 30 tablet 3  . lisinopril (PRINIVIL,ZESTRIL) 20 MG tablet Take 1 tablet (20 mg total) by mouth daily. 90 tablet 3  . nitroGLYCERIN (NITROSTAT) 0.4 MG SL tablet Place 1 tablet (0.4 mg total) under the tongue every 5 (five) minutes as needed. 25 tablet 3   No current facility-administered medications for this visit.     Allergies:   Patient has no known allergies.    Social History:  The patient  reports that he has been smoking Cigarettes and E-cigarettes.  He has a 50.00 pack-year smoking history. He has never used smokeless tobacco. He reports that he drinks about 0.6 oz of alcohol per week .   Family History:  The patient's family history is negative for coronary artery disease.   ROS:  Please see the history of present illness.   Otherwise, review of systems are positive for none.   All other systems are reviewed and negative.    PHYSICAL EXAM: VS:  BP (!) 140/98 (BP Location: Left Arm, Patient Position: Sitting, Cuff Size: Normal)   Ht 5\' 8"  (1.727 m)   Wt 231 lb 8 oz (105 kg)  BMI 35.20 kg/m  , BMI Body mass index is 35.2 kg/m. GEN: Well nourished, well developed, in no acute distress  HEENT: normal  Neck: no JVD, carotid bruits, or masses Cardiac: RRR; no murmurs, rubs, or gallops,no edema  Respiratory:  clear to auscultation bilaterally, normal work of breathing GI: soft, nontender, nondistended, + BS MS: no deformity or atrophy  Skin: warm and dry, no rash Neuro:  Strength and sensation are intact Psych: euthymic mood, full affect   EKG:  EKG is ordered today. EKG shows normal sinus rhythm with  borderline LVH.   Recent Labs: 09/23/2015: Hemoglobin 14.2; Platelets 214; TSH 0.768 09/24/2015: BUN 10; Creatinine, Ser 1.11; Potassium 3.7; Sodium 140 11/06/2015: ALT 11    Lipid Panel    Component Value Date/Time   CHOL 107 11/06/2015 0950   TRIG 53 11/06/2015 0950   HDL 34 (L) 11/06/2015 0950   CHOLHDL 3.1 11/06/2015 0950   CHOLHDL 6.0 09/24/2015 0405   VLDL 22 09/24/2015 0405   LDLCALC 62 11/06/2015 0950      Wt Readings from Last 3 Encounters:  03/25/16 231 lb 8 oz (105 kg)  12/27/15 240 lb 8 oz (109.1 kg)  10/02/15 228 lb (103.4 kg)         ASSESSMENT AND PLAN:  1.  Coronary artery disease involving native coronary arteries without angina:   He is doing very well with no anginal symptoms. Continue medical therapy on dual antiplatelet medications for at least one year.  2. Essential hypertension:  Blood pressure has improved but still not controlled. I increased lisinopril to 40 mg once daily.  3. Hyperlipidemia: Lipid profile was optimal in August with an LDL of 62. I asked him to resume taking atorvastatin as  4. Tobacco use: He is using electronic cigarettes. I discussed with him the importance of complete cessation of nicotine products.    Disposition:   FU with me in 6 months  Signed,  Lorine Bears, MD  03/25/2016 2:04 PM    [facility]

## 2016-05-12 ENCOUNTER — Emergency Department
Admission: EM | Admit: 2016-05-12 | Discharge: 2016-05-12 | Disposition: A | Discharge status: Home / Self Care | Payer: Medicaid Other | Attending: Emergency Medicine | Admitting: Emergency Medicine

## 2016-05-12 ENCOUNTER — Emergency Department: Payer: Medicaid Other

## 2016-05-12 ENCOUNTER — Encounter: Payer: Self-pay | Admitting: Emergency Medicine

## 2016-05-12 DIAGNOSIS — I10 Essential (primary) hypertension: Secondary | ICD-10-CM | POA: Diagnosis not present

## 2016-05-12 DIAGNOSIS — Y999 Unspecified external cause status: Secondary | ICD-10-CM | POA: Insufficient documentation

## 2016-05-12 DIAGNOSIS — I252 Old myocardial infarction: Secondary | ICD-10-CM | POA: Diagnosis not present

## 2016-05-12 DIAGNOSIS — Y929 Unspecified place or not applicable: Secondary | ICD-10-CM | POA: Diagnosis not present

## 2016-05-12 DIAGNOSIS — I2510 Atherosclerotic heart disease of native coronary artery without angina pectoris: Secondary | ICD-10-CM | POA: Diagnosis not present

## 2016-05-12 DIAGNOSIS — W1839XA Other fall on same level, initial encounter: Secondary | ICD-10-CM | POA: Insufficient documentation

## 2016-05-12 DIAGNOSIS — F17210 Nicotine dependence, cigarettes, uncomplicated: Secondary | ICD-10-CM | POA: Insufficient documentation

## 2016-05-12 DIAGNOSIS — Z79899 Other long term (current) drug therapy: Secondary | ICD-10-CM | POA: Insufficient documentation

## 2016-05-12 DIAGNOSIS — Z7982 Long term (current) use of aspirin: Secondary | ICD-10-CM | POA: Insufficient documentation

## 2016-05-12 DIAGNOSIS — S42135A Nondisplaced fracture of coracoid process, left shoulder, initial encounter for closed fracture: Secondary | ICD-10-CM | POA: Diagnosis not present

## 2016-05-12 DIAGNOSIS — Y939 Activity, unspecified: Secondary | ICD-10-CM | POA: Diagnosis not present

## 2016-05-12 DIAGNOSIS — S4992XA Unspecified injury of left shoulder and upper arm, initial encounter: Secondary | ICD-10-CM | POA: Diagnosis present

## 2016-05-12 MED ORDER — HYDROCODONE-ACETAMINOPHEN 5-325 MG PO TABS
1.0000 | ORAL_TABLET | ORAL | Status: AC
  Administered 2016-05-12: 1 via ORAL
  Filled 2016-05-12: qty 1

## 2016-05-12 MED ORDER — HYDROCODONE-ACETAMINOPHEN 5-325 MG PO TABS: 1.0000 | ORAL_TABLET | Freq: Four times a day (QID) | ORAL | 0 refills | Status: DC | PRN

## 2016-05-12 NOTE — ED Provider Notes (Signed)
f [facility]-EMERGENCY DEPARTMENT Provider Note   CSN: [id number] Arrival date & time: 05/12/16  2138     History   Chief Complaint Chief Complaint  Patient presents with  . Arm Injury    HPI Bryan Whitney is a 56 y.o. male presents to the emergency department for evaluation of left arm pain. Patient fell earlier this afternoon onto his left hand, developed left shoulder pain. Points to the left anterior shoulder. Pain is moderate. No numbness or tingling. No head injury, neck pain, numbness or tingling upper extremities. Pain is moderate he has not had any medications for pain. Marland KitchenHPI  Past Medical History:  Diagnosis Date  . Coronary artery disease    Inferior ST elevation myocardial infarction in July 2017. Cardiac catheterization showed occluded proximal right coronary artery with 40% disease in proximal LAD and OM1. Successful angioplasty and drug-eluting stent placement to the right coronary artery. Ejection fraction was normal.  . Hyperlipidemia   . Hypertension   . Tobacco use     Patient Active Problem List   Diagnosis Date Noted  . Tobacco use   . Hyperlipidemia   . Benign essential HTN 09/24/2015  . Dyslipidemia 09/24/2015  . Tobacco abuse 09/24/2015  . CAD (coronary artery disease) 09/24/2015  . ST elevation (STEMI) myocardial infarction involving other coronary artery of inferior wall [facility])     Past Surgical History:  Procedure Laterality Date  . CARDIAC CATHETERIZATION N/A 09/23/2015   Procedure: Left Heart Cath and Coronary Angiography;  Surgeon: Iran Ouch, MD;  [facility];  Service: Cardiovascular;  Laterality: N/A;  . CARDIAC CATHETERIZATION N/A 09/23/2015   Procedure: Coronary Stent Intervention;  Surgeon: Iran Ouch, MD;  [facility]: [facility];  Service: Cardiovascular;  Laterality: N/A;  . HERNIA REPAIR         Home Medications    Prior to Admission medications   Medication Sig Start Date End Date Taking? Authorizing  Provider  amLODipine (NORVASC) 5 MG tablet Take 1 tablet (5 mg total) by mouth daily. 12/27/15 03/26/16  Iran Ouch, MD  aspirin 81 MG chewable tablet Chew 1 tablet (81 mg total) by mouth daily. 09/24/15   Azalee Course, PA  atorvastatin (LIPITOR) 80 MG tablet Take 1 tablet (80 mg total) by mouth daily. 12/27/15 03/26/16  Iran Ouch, MD  clopidogrel (PLAVIX) 75 MG tablet Take 1 tablet (75 mg total) by mouth daily. 12/27/15   Iran Ouch, MD  HYDROcodone-acetaminophen (NORCO) 5-325 MG tablet Take 1 tablet by mouth every 6 (six) hours as needed for moderate pain. 05/12/16   Evon Slack, PA-C  lisinopril (PRINIVIL,ZESTRIL) 40 MG tablet Take 1 tablet (40 mg total) by mouth daily. 03/25/16   Iran Ouch, MD  nitroGLYCERIN (NITROSTAT) 0.4 MG SL tablet Place 1 tablet (0.4 mg total) under the tongue every 5 (five) minutes as needed. 09/24/15   Azalee Course, PA    Family History No family history on file.  Social History Social History  Substance Use Topics  . Smoking status: Current Every Day Smoker    Packs/day: 1.00    Years: 50.00    Types: Cigarettes, E-cigarettes  . Smokeless tobacco: Never Used  . Alcohol use 0.6 oz/week    1 Cans of beer per week     Comment: occasional     Allergies   Patient has no known allergies.   Review of Systems Review of Systems  Constitutional: Negative for chills and fever.  HENT:  Negative for ear pain and sore throat.   Eyes: Negative for pain and visual disturbance.  Respiratory: Negative for cough and shortness of breath.   Cardiovascular: Negative for chest pain and palpitations.  Gastrointestinal: Negative for abdominal pain and vomiting.  Genitourinary: Negative for dysuria and hematuria.  Musculoskeletal: Positive for arthralgias. Negative for back pain.  Skin: Negative for color change and rash.  Neurological: Negative for seizures and syncope.  All other systems reviewed and are negative.    Physical Exam Updated Vital  Signs BP (!) 157/91   Pulse 83   Temp 99.8 F (37.7 C) (Oral)   Resp 16   Ht 5\' 8"  (1.727 m)   Wt 104.3 kg   SpO2 96%   BMI 34.97 kg/m   Physical Exam  Constitutional: He appears well-developed and well-nourished.  HENT:  Head: Normocephalic and atraumatic.  Eyes: Conjunctivae are normal.  Neck: Neck supple.  Cardiovascular: Normal rate and regular rhythm.   No murmur heard. Pulmonary/Chest: Effort normal and breath sounds normal. No respiratory distress.  Abdominal: Soft. There is no tenderness.  Musculoskeletal: He exhibits no edema.  Examination of the left shoulder shows patient has active abduction and flexion to 95. Passively he can be increased to 115 with pain. He has a negative drop arm test. Positive Hawkins and impending test. He is tender along the anterior shoulder. Nontender along the clavicles. There is no bruising or ecchymosis noted. He has 5 out of 5 strength with biceps and triceps strength testing. His neuro rest intact in left upper extremity.  Neurological: He is alert.  Skin: Skin is warm and dry.  Psychiatric: He has a normal mood and affect. His behavior is normal. Thought content normal.  Nursing note and vitals reviewed.    ED Treatments / Results  Labs (all labs ordered are listed, but only abnormal results are displayed) Labs Reviewed - No data to display  EKG  EKG Interpretation None       Radiology Dg Shoulder Left  Result Date: 05/12/2016 CLINICAL DATA:  Pain after fall EXAM: LEFT SHOULDER - 2+ VIEW COMPARISON:  CXR 09/23/2015 FINDINGS: There appears to be a fracture lucency at the base of the coracoid. The glenohumeral and AC joints are maintained. The adjacent ribs and lung are unremarkable. IMPRESSION: Lucency at the base of the coracoid seen only on one view. Findings are suspicious for nondisplaced fracture. Electronically Signed   By: Tollie Ethavid  Kwon M.D.   On: 05/12/2016 22:40    Procedures Procedures (including critical care  time) SPLINT APPLICATION Date/Time: 11:34 PM Authorized by: Patience MuscaGAINES, Lesslie Mossa CHRISTOPHER Consent: Verbal consent obtained. Risks and benefits: risks, benefits and alternatives were discussed Consent given by: patient Splint applied by: ED tech Location details: Left shoulder  Splint type: Sling  Supplies used: Sling  Post-procedure: The splinted body part was neurovascularly unchanged following the procedure. Patient tolerance: Patient tolerated the procedure well with no immediate complications.     Medications Ordered in ED Medications  HYDROcodone-acetaminophen (NORCO/VICODIN) 5-325 MG per tablet 1 tablet (1 tablet Oral Given 05/12/16 2217)     Initial Impression / Assessment and Plan / ED Course  I have reviewed the triage vital signs and the nursing notes.  Pertinent labs & imaging results that were available during my care of the patient were reviewed by me and considered in my medical decision making (see chart for details).     56 year old male with nondisplaced coracoid fracture. Is placed into a sling. He will perform pendulum  exercises anginal range of motion. He will avoid any pushing pulling or lifting a resistance with the left upper extremity. He'll follow-up with orthopedics this week. Final Clinical Impressions(s) / ED Diagnoses   Final diagnoses:  Nondisplaced fracture of coracoid process, left shoulder, initial encounter for closed fracture    New Prescriptions New Prescriptions   HYDROCODONE-ACETAMINOPHEN (NORCO) 5-325 MG TABLET    Take 1 tablet by mouth every 6 (six) hours as needed for moderate pain.     Evon Slack, PA-C 05/12/16 2334    Evon Slack, PA-C 05/12/16 1610    Arnaldo Natal, MD 05/12/16 [phone]

## 2016-05-12 NOTE — ED Triage Notes (Signed)
C/O left arm pain.  Patient states he fell this afternoon.

## 2016-05-12 NOTE — ED Notes (Signed)
Patient stated he does have a ride in the lobby

## 2016-05-12 NOTE — ED Notes (Signed)
See triage note, pt states earlier today he fell. When asked how he fell pt states "I fell." This nurse asked if pt fell forward or on his back and pt states he fell forward and landed on his left hand, arm and shoulder. Pt reports pain "mainly in shoulder" and "when I move it."

## 2016-05-12 NOTE — Discharge Instructions (Signed)
Please wear sling as needed for comfort. Avoid any lifting pushing or pulling with the left upper extremity. Apply ice 20 minutes every hour for the next 2-3 days. Follow-up orthopedics in 3-5 days for recheck.

## 2016-05-20 ENCOUNTER — Other Ambulatory Visit: Payer: Self-pay | Admitting: Cardiovascular Disease

## 2016-08-07 ENCOUNTER — Telehealth: Payer: Self-pay | Admitting: Family Medicine

## 2016-08-07 NOTE — Telephone Encounter (Signed)
[facility] pharmacy calling to verify how much of Carvedilol patient is to be taking   Patient states he is to take  12.5   Pharmacy thinks it is 3.125 mg   Please call back

## 2016-08-07 NOTE — Telephone Encounter (Signed)
Please advise pt does not have carvedilol on medication list.

## 2016-08-08 ENCOUNTER — Other Ambulatory Visit: Payer: Self-pay

## 2016-08-08 MED ORDER — LISINOPRIL 40 MG PO TABS: 40.0000 mg | ORAL_TABLET | Freq: Every day | ORAL | 2 refills | Status: DC

## 2016-08-08 MED ORDER — CLOPIDOGREL BISULFATE 75 MG PO TABS
75.0000 mg | ORAL_TABLET | Freq: Every day | ORAL | 2 refills | Status: DC
Start: 2016-08-08 — End: 2016-08-08

## 2016-08-08 MED ORDER — ATORVASTATIN CALCIUM 80 MG PO TABS: 80.0000 mg | ORAL_TABLET | Freq: Every day | ORAL | 2 refills | Status: DC

## 2016-08-08 MED ORDER — AMLODIPINE BESYLATE 5 MG PO TABS: 5.0000 mg | ORAL_TABLET | Freq: Every day | ORAL | 2 refills | Status: DC

## 2016-08-08 MED ORDER — CLOPIDOGREL BISULFATE 75 MG PO TABS: 75.0000 mg | ORAL_TABLET | Freq: Every day | ORAL | 2 refills | Status: DC

## 2016-08-08 NOTE — Telephone Encounter (Signed)
Pt's coreg was discontinued during Oct OV with Dr. Kirke CorinArida. Confirmed medication was taken off list at that time and noted on AVS. S/w Sarah at [facility] as pt transferring medications [facility]. She states pt thought he was taking coreg. Reviewed w/Sarah who verbalized understanding that pt does not take coreg. S/w pt who reports he doesn't take amlodipine or plavix as he was not aware and states the pharmacy "never gave it to me". Only medication he knows he takes is lisinopril. Asked me to send in medication refills for what he is suppose to be taking and he will pick them up [facility]. Pt refilled plavix on Dec 24, Feb 6, March 6 and April 20. He refilled amlodipine Dec 14 and April 20 Notified pt who thinks [facility] is filling prescriptions he is not even suppose to be taking. Stated to patient that I could not speak about that but let him know when plavix and amlodipine were last filled. He states [facility] [address]. I have sent in refills for plavix, amlodipine, atorvastatin and lisinopril to [facility]. Notified Sarah at [facility] who will discontinue refills. Mailed cardiac medications list to pt's home address.

## 2016-09-30 DIAGNOSIS — E291 Testicular hypofunction: Secondary | ICD-10-CM | POA: Insufficient documentation

## 2016-09-30 DIAGNOSIS — N529 Male erectile dysfunction, unspecified: Secondary | ICD-10-CM | POA: Insufficient documentation

## 2017-01-21 ENCOUNTER — Other Ambulatory Visit: Payer: Self-pay | Admitting: Cardiovascular Disease

## 2017-01-23 ENCOUNTER — Other Ambulatory Visit: Payer: Self-pay | Admitting: Cardiovascular Disease

## 2017-01-25 ENCOUNTER — Other Ambulatory Visit: Payer: Self-pay | Admitting: Cardiovascular Disease

## 2017-01-28 ENCOUNTER — Telehealth: Payer: Self-pay | Admitting: Cardiovascular Disease

## 2017-01-28 ENCOUNTER — Other Ambulatory Visit: Payer: Self-pay

## 2017-01-28 MED ORDER — LISINOPRIL 40 MG PO TABS: 40.0000 mg | ORAL_TABLET | Freq: Every day | ORAL | 0 refills | Status: DC

## 2017-01-28 MED ORDER — ATORVASTATIN CALCIUM 80 MG PO TABS: 80.0000 mg | ORAL_TABLET | Freq: Every day | ORAL | 0 refills | Status: DC

## 2017-01-28 NOTE — Telephone Encounter (Signed)
Requested Prescriptions   Signed Prescriptions Disp Refills  . lisinopril (PRINIVIL,ZESTRIL) 40 MG tablet 90 tablet 0    Sig: Take 1 tablet (40 mg total) daily by mouth.    Authorizing Provider: Lorine BearsARIDA, MUHAMMAD A    Ordering User: Margrett RudSLAYTON, Jenner Rosier N  . atorvastatin (LIPITOR) 80 MG tablet 90 tablet 0    Sig: Take 1 tablet (80 mg total) daily by mouth.    Authorizing Provider: Lorine BearsARIDA, MUHAMMAD A    Ordering User: Margrett RudSLAYTON, Jessilynn Taft N

## 2017-01-28 NOTE — Telephone Encounter (Signed)
Refills sent in

## 2017-01-28 NOTE — Telephone Encounter (Signed)
°*  STAT* If patient is at the pharmacy, call can be transferred to refill team.   1. Which medications need to be refilled? (please list name of each medication and dose if known) lisinopril 40 MG and atorvastatin 80 MG  2. Which pharmacy/location (including street and city if local pharmacy) is medication to be sent to? [facility] [address]  3. Do they need a 30 day or 90 day supply? 90 day  Patient states that the pharmacist has attempted to send in request Patient has been out of medication for 3 days Please call

## 2017-03-07 IMAGING — DX DG CHEST 1V PORT
1 series · 1 of 1 positions shown · non-contrast
Comparison: None.

CLINICAL DATA: Awoke this morning with chest pain.

EXAM:
PORTABLE CHEST 1 VIEW

[chest ap]
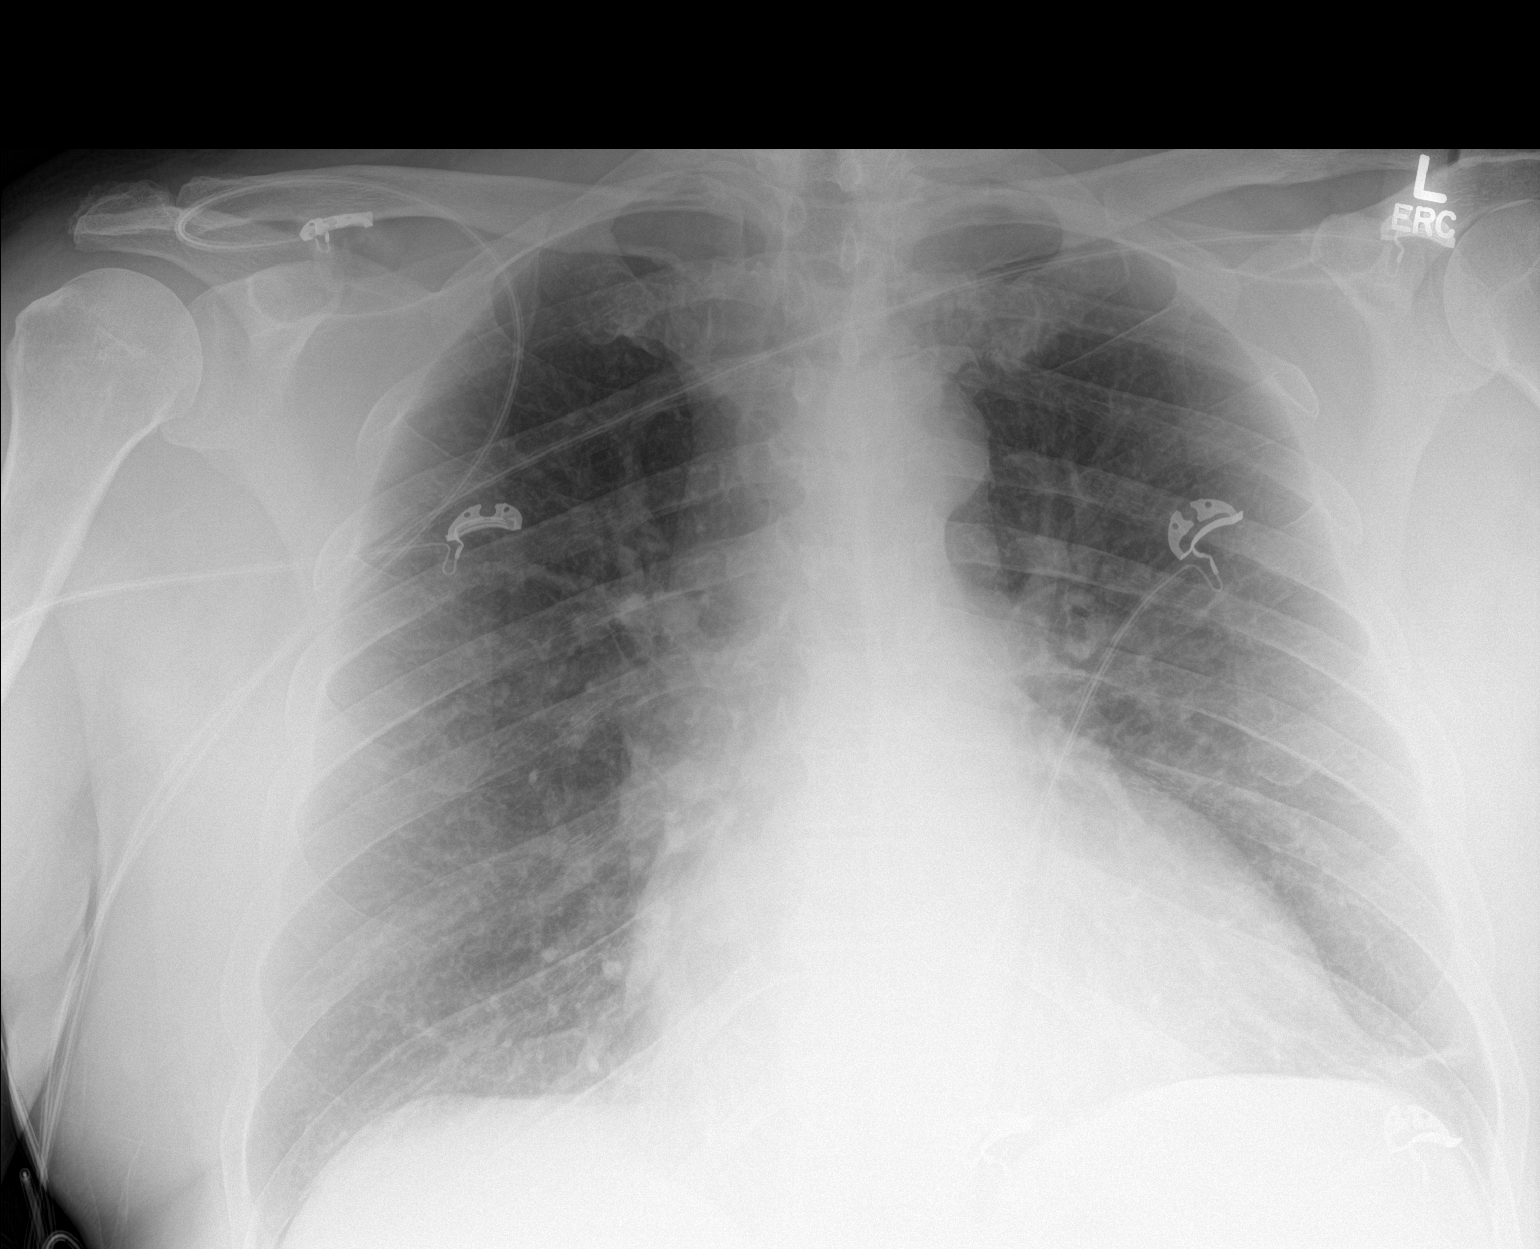

[1 of 1 positions shown; findings below may reference images not displayed]

FINDINGS: Mild-to-moderate cardiomegaly. There is mild tortuosity of the
thoracic aorta. There is vascular congestion and increased bilateral
perihilar densities. Subsegmental atelectasis at the left lung base.
No confluent airspace disease, large pleural effusion or
pneumothorax. Mild hyperinflation.
IMPRESSION: Cardiomegaly and vascular congestion. Increased perihilar opacities
may reflect bronchial thickening or pulmonary edema.

## 2017-04-02 ENCOUNTER — Encounter: Payer: Self-pay | Admitting: Cardiovascular Disease

## 2017-04-02 ENCOUNTER — Ambulatory Visit (INDEPENDENT_AMBULATORY_CARE_PROVIDER_SITE_OTHER): Payer: Medicaid Other | Admitting: Cardiovascular Disease

## 2017-04-02 VITALS — BP 150/90 | HR 66 | Ht 68.0 in | Wt 231.0 lb

## 2017-04-02 DIAGNOSIS — Z720 Tobacco use: Secondary | ICD-10-CM

## 2017-04-02 DIAGNOSIS — E785 Hyperlipidemia, unspecified: Secondary | ICD-10-CM

## 2017-04-02 DIAGNOSIS — I2510 Atherosclerotic heart disease of native coronary artery without angina pectoris: Secondary | ICD-10-CM

## 2017-04-02 DIAGNOSIS — I10 Essential (primary) hypertension: Secondary | ICD-10-CM | POA: Diagnosis not present

## 2017-04-02 MED ORDER — AMLODIPINE BESYLATE 5 MG PO TABS: 5.0000 mg | ORAL_TABLET | Freq: Every day | ORAL | 3 refills | Status: DC

## 2017-04-02 MED ORDER — ASPIRIN EC 81 MG PO TBEC: 81.0000 mg | DELAYED_RELEASE_TABLET | Freq: Every day | ORAL | 3 refills | Status: DC

## 2017-04-02 MED ORDER — LISINOPRIL 40 MG PO TABS: 40.0000 mg | ORAL_TABLET | Freq: Every day | ORAL | 3 refills | Status: DC

## 2017-04-02 MED ORDER — ATORVASTATIN CALCIUM 80 MG PO TABS: 80.0000 mg | ORAL_TABLET | Freq: Every day | ORAL | 3 refills | Status: DC

## 2017-04-02 MED ORDER — NITROGLYCERIN 0.4 MG SL SUBL: 0.4000 mg | SUBLINGUAL_TABLET | SUBLINGUAL | 3 refills | Status: AC | PRN

## 2017-04-02 NOTE — Patient Instructions (Signed)
Medication Instructions:  Your physician has recommended you make the following change in your medication:  RESUME aspirin 81mg  once daily START taking amlodipine 5mg  once daily   Labwork: none  Testing/Procedures: none  Follow-Up: Your physician wants you to follow-up in: 6 months with Dr. Kirke CorinArida.  You will receive a reminder letter in the mail two months in advance. If you don't receive a letter, please call our office to schedule the follow-up appointment.   Any Other Special Instructions Will Be Listed Below (If Applicable).     If you need a refill on your cardiac medications before your next appointment, please call your pharmacy.

## 2017-04-02 NOTE — Progress Notes (Signed)
Cardiology Office Note   Date:  04/02/2017   ID:  Bryan Whitney, DOB Jun 24, 1960, MRN [id number]  PCP:  Patient, No Pcp Per  Cardiologist:   Lorine Bears, MD   Chief Complaint  Patient presents with  . OTHER    OD 6 month f/u. Meds reviewed verbally with pt.      History of Present Illness: Wayde Gopaul is a 57 y.o. male who presents for a follow-up visit regarding coronary artery disease. He had inferior ST elevation myocardial infarction in July 2017.Cardiac catheterization  showed occluded proximal right coronary artery with mild disease affecting the LAD and OM1. He underwent successful angioplasty and drug-eluting stent placement to the right coronary artery without complications. Ejection fraction was normal. He stopped smoking but continues to vape. He ran out of amlodipine and Plavix and did not call us to get refills.  Also he has not been taking aspirin.  Fortunately, he has not had any chest pain, shortness of breath or palpitations.    Past Medical History:  Diagnosis Date  . Coronary artery disease    Inferior ST elevation myocardial infarction in July 2017. Cardiac catheterization showed occluded proximal right coronary artery with 40% disease in proximal LAD and OM1. Successful angioplasty and drug-eluting stent placement to the right coronary artery. Ejection fraction was normal.  . Hyperlipidemia   . Hypertension   . Tobacco use     Past Surgical History:  Procedure Laterality Date  . CARDIAC CATHETERIZATION N/A 09/23/2015   Procedure: Left Heart Cath and Coronary Angiography;  Surgeon: Iran Ouch, MD;  [facility];  Service: Cardiovascular;  Laterality: N/A;  . CARDIAC CATHETERIZATION N/A 09/23/2015   Procedure: Coronary Stent Intervention;  Surgeon: Iran Ouch, MD;  Location: [facility];  Service: Cardiovascular;  Laterality: N/A;  . HERNIA REPAIR       Current Outpatient Medications  Medication Sig Dispense Refill    . atorvastatin (LIPITOR) 80 MG tablet Take 1 tablet (80 mg total) daily by mouth. 90 tablet 0  . lisinopril (PRINIVIL,ZESTRIL) 40 MG tablet Take 1 tablet (40 mg total) daily by mouth. 90 tablet 0  . nitroGLYCERIN (NITROSTAT) 0.4 MG SL tablet Place 1 tablet (0.4 mg total) under the tongue every 5 (five) minutes as needed. 25 tablet 3   No current facility-administered medications for this visit.     Allergies:   Patient has no known allergies.    Social History:  The patient  reports that he has been smoking cigarettes and e-cigarettes.  He has a 50.00 pack-year smoking history. he has never used smokeless tobacco. He reports that he drinks about 0.6 oz of alcohol per week. He reports that he does not use drugs.   Family History:  The patient's family history is negative for coronary artery disease.   ROS:  Please see the history of present illness.   Otherwise, review of systems are positive for none.   All other systems are reviewed and negative.    PHYSICAL EXAM: VS:  BP (!) 150/90 (BP Location: Left Arm, Patient Position: Sitting, Cuff Size: Normal)   Pulse 66   Ht 5\' 8"  (1.727 m)   Wt 231 lb (104.8 kg)   BMI 35.12 kg/m  , BMI Body mass index is 35.12 kg/m. GEN: Well nourished, well developed, in no acute distress  HEENT: normal  Neck: no JVD, carotid bruits, or masses Cardiac: RRR; no murmurs, rubs, or gallops,no edema  Respiratory:  clear  to auscultation bilaterally, normal work of breathing GI: soft, nontender, nondistended, + BS MS: no deformity or atrophy  Skin: warm and dry, no rash Neuro:  Strength and sensation are intact Psych: euthymic mood, full affect   EKG:  EKG is ordered today. EKG shows normal sinus rhythm with borderline LVH.   Recent Labs: No results found for requested labs within last 8760 hours.    Lipid Panel    Component Value Date/Time   CHOL 107 11/06/2015 0950   TRIG 53 11/06/2015 0950   HDL 34 (L) 11/06/2015 0950   CHOLHDL 3.1  11/06/2015 0950   CHOLHDL 6.0 09/24/2015 0405   VLDL 22 09/24/2015 0405   LDLCALC 62 11/06/2015 0950      Wt Readings from Last 3 Encounters:  04/02/17 231 lb (104.8 kg)  05/12/16 230 lb (104.3 kg)  03/25/16 231 lb 8 oz (105 kg)         ASSESSMENT AND PLAN:  1.  Coronary artery disease involving native coronary arteries without angina:   I discussed with him the importance of compliance with his medications and the importance of taking aspirin 81 mg once daily.  No need to resume Plavix for now.  2. Essential hypertension:  Blood pressure is not controlled but he has not been taking amlodipine.  This was refilled today.  3. Hyperlipidemia: Continue treatment with atorvastatin.  Most recent LDL was 62.  4. Tobacco use: He is using electronic cigarettes. I discussed with him the importance of complete cessation of nicotine products.    Disposition:   FU with me in 6 months  Signed,  Lorine BearsMuhammad Jackye Dever, MD  04/02/2017 4:01 PM    [facility]

## 2017-05-25 ENCOUNTER — Telehealth: Payer: Self-pay | Admitting: Urology

## 2017-05-25 NOTE — Telephone Encounter (Signed)
[facility] patient calling for a refill of clomid to be sent to [facility] on [address] in [place].  He can be reached at  [phone].

## 2017-05-27 NOTE — Telephone Encounter (Signed)
Pt called again asking for refill of clomid. Asks for a 30 day supply.

## 2017-05-28 MED ORDER — CLOMIPHENE CITRATE 50 MG PO TABS: 25.0000 mg | ORAL_TABLET | Freq: Every day | ORAL | 1 refills | Status: DC

## 2017-05-28 NOTE — Telephone Encounter (Signed)
Rx sent.  Needs a follow-up visit

## 2017-06-12 ENCOUNTER — Telehealth: Payer: Self-pay | Admitting: Cardiovascular Disease

## 2017-06-12 NOTE — Telephone Encounter (Signed)
Patient needs dot clearance letter.  Please call

## 2017-06-15 NOTE — Telephone Encounter (Signed)
Patient with 09/2015 STEMI s/p angioplasty and drug-eluding stent requests DOT clearance letter.  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  Patient had OV Jan 2019 with Dr. Kirke CorinArida. Aspirin resumed and amlodipine refilled. BP 150/90 as he had not been taking amlodipine. Per patient, BP 138/83 at DOT physical. He is feeling well with no chest pain.  Will route to Dr. Kirke CorinArida for approval of letter.

## 2017-06-17 ENCOUNTER — Other Ambulatory Visit: Payer: Self-pay

## 2017-06-17 DIAGNOSIS — Z024 Encounter for examination for driving license: Secondary | ICD-10-CM

## 2017-06-17 NOTE — Telephone Encounter (Signed)
Per DOT guidelines, schedule him for a GXT.

## 2017-06-17 NOTE — Telephone Encounter (Signed)
S/w patient regarding recommendations. Reviewed GXT instructions to which patient is agreeable. States he has 45 days in which to have testing complete.  [facility].

## 2017-06-22 ENCOUNTER — Telehealth: Payer: Self-pay | Admitting: Cardiovascular Disease

## 2017-06-22 NOTE — Telephone Encounter (Signed)
I called and spoke with patient regarding his GXT scheduled for tomorrow morning at 8:30 am.  He is aware: - arrive 10-15 minutes prior to register - light breakfast prior - no vaping now until prior to his test - no caffeine from now until prior to his test - his meds are ok to take in the morning. - wear comfortable clothes & tennis/ non-skid shoes.  He voices understanding.

## 2017-06-23 ENCOUNTER — Ambulatory Visit (INDEPENDENT_AMBULATORY_CARE_PROVIDER_SITE_OTHER): Payer: Medicaid Other

## 2017-06-23 DIAGNOSIS — Z024 Encounter for examination for driving license: Secondary | ICD-10-CM

## 2017-06-26 LAB — EXERCISE TOLERANCE TEST
CSEPED: 5 min
CSEPEDS: 50 s
CSEPHR: 87 %
CSEPPHR: 142 {beats}/min
Estimated workload: 7 METS
MPHR: 163 {beats}/min
RPE: 12
Rest HR: 87 {beats}/min

## 2017-06-29 ENCOUNTER — Other Ambulatory Visit: Payer: Self-pay

## 2017-06-29 MED ORDER — CARVEDILOL 6.25 MG PO TABS: 6.2500 mg | ORAL_TABLET | Freq: Two times a day (BID) | ORAL | 5 refills | Status: DC

## 2017-07-20 ENCOUNTER — Other Ambulatory Visit: Payer: Self-pay

## 2017-07-20 MED ORDER — LISINOPRIL 40 MG PO TABS: 40.0000 mg | ORAL_TABLET | Freq: Every day | ORAL | 3 refills | Status: DC

## 2017-07-20 NOTE — Telephone Encounter (Signed)
*  STAT* If patient is at the pharmacy, call can be transferred to refill team.   1. Which medications need to be refilled? (please list name of each medication and dose if known) Lisinopril  2. Which pharmacy/location (including street and city if local pharmacy) is medication to be sent to? [facility] [place]   3. Do they need a 30 day or 90 day supply? 90

## 2017-08-12 ENCOUNTER — Telehealth: Payer: Self-pay | Admitting: Cardiovascular Disease

## 2017-08-12 DIAGNOSIS — I2119 ST elevation (STEMI) myocardial infarction involving other coronary artery of inferior wall: Secondary | ICD-10-CM

## 2017-08-12 DIAGNOSIS — I10 Essential (primary) hypertension: Secondary | ICD-10-CM

## 2017-08-12 NOTE — Telephone Encounter (Signed)
Echo order entered. Routing to scheduling to call patient to schedule.

## 2017-08-12 NOTE — Telephone Encounter (Signed)
Is it ok to order the patient to have an echo as well? Last echo was 2017. Had GXT on 06/23/17.

## 2017-08-12 NOTE — Telephone Encounter (Signed)
Pt calling stating DOT is now requiring him to have an Echocardiogram done  He is wanting to know if we can order this   Please advise

## 2017-08-12 NOTE — Telephone Encounter (Signed)
Echo is fine

## 2017-08-12 NOTE — Telephone Encounter (Signed)
Lmov for patient to call and schedule Echo °

## 2017-08-13 NOTE — Telephone Encounter (Signed)
Lmov for patient to call and schedule Echo

## 2017-08-14 ENCOUNTER — Other Ambulatory Visit: Payer: Self-pay | Admitting: Cardiovascular Disease

## 2017-08-14 DIAGNOSIS — I2119 ST elevation (STEMI) myocardial infarction involving other coronary artery of inferior wall: Secondary | ICD-10-CM

## 2017-08-14 NOTE — Telephone Encounter (Signed)
Pt has been scheduled 08/25/17  Nothing else needed.

## 2017-08-25 ENCOUNTER — Other Ambulatory Visit: Payer: Medicaid Other

## 2017-08-27 ENCOUNTER — Other Ambulatory Visit: Payer: Medicaid Other

## 2017-09-08 ENCOUNTER — Ambulatory Visit (INDEPENDENT_AMBULATORY_CARE_PROVIDER_SITE_OTHER): Payer: Medicaid Other

## 2017-09-08 ENCOUNTER — Other Ambulatory Visit: Payer: Self-pay

## 2017-09-08 DIAGNOSIS — I2119 ST elevation (STEMI) myocardial infarction involving other coronary artery of inferior wall: Secondary | ICD-10-CM

## 2017-09-11 ENCOUNTER — Encounter: Payer: Self-pay | Admitting: *Deleted

## 2017-09-15 ENCOUNTER — Encounter: Payer: Self-pay | Admitting: *Deleted

## 2017-10-12 ENCOUNTER — Other Ambulatory Visit: Payer: Self-pay

## 2017-10-12 ENCOUNTER — Telehealth: Payer: Self-pay | Admitting: Cardiovascular Disease

## 2017-10-12 MED ORDER — AMLODIPINE BESYLATE 5 MG PO TABS: 5.0000 mg | ORAL_TABLET | Freq: Every day | ORAL | 1 refills | Status: DC

## 2017-10-12 NOTE — Telephone Encounter (Signed)
error 

## 2017-10-25 IMAGING — CR DG SHOULDER 2+V*L*
3 series · 4 of 4 positions shown · non-contrast
Comparison: CXR 09/23/2015

CLINICAL DATA: Pain after fall

EXAM:
LEFT SHOULDER - 2+ VIEW

[Series 1: shoulder grashey · 0.14mm/px · 2 of 2 slices shown]
[im 1/2]
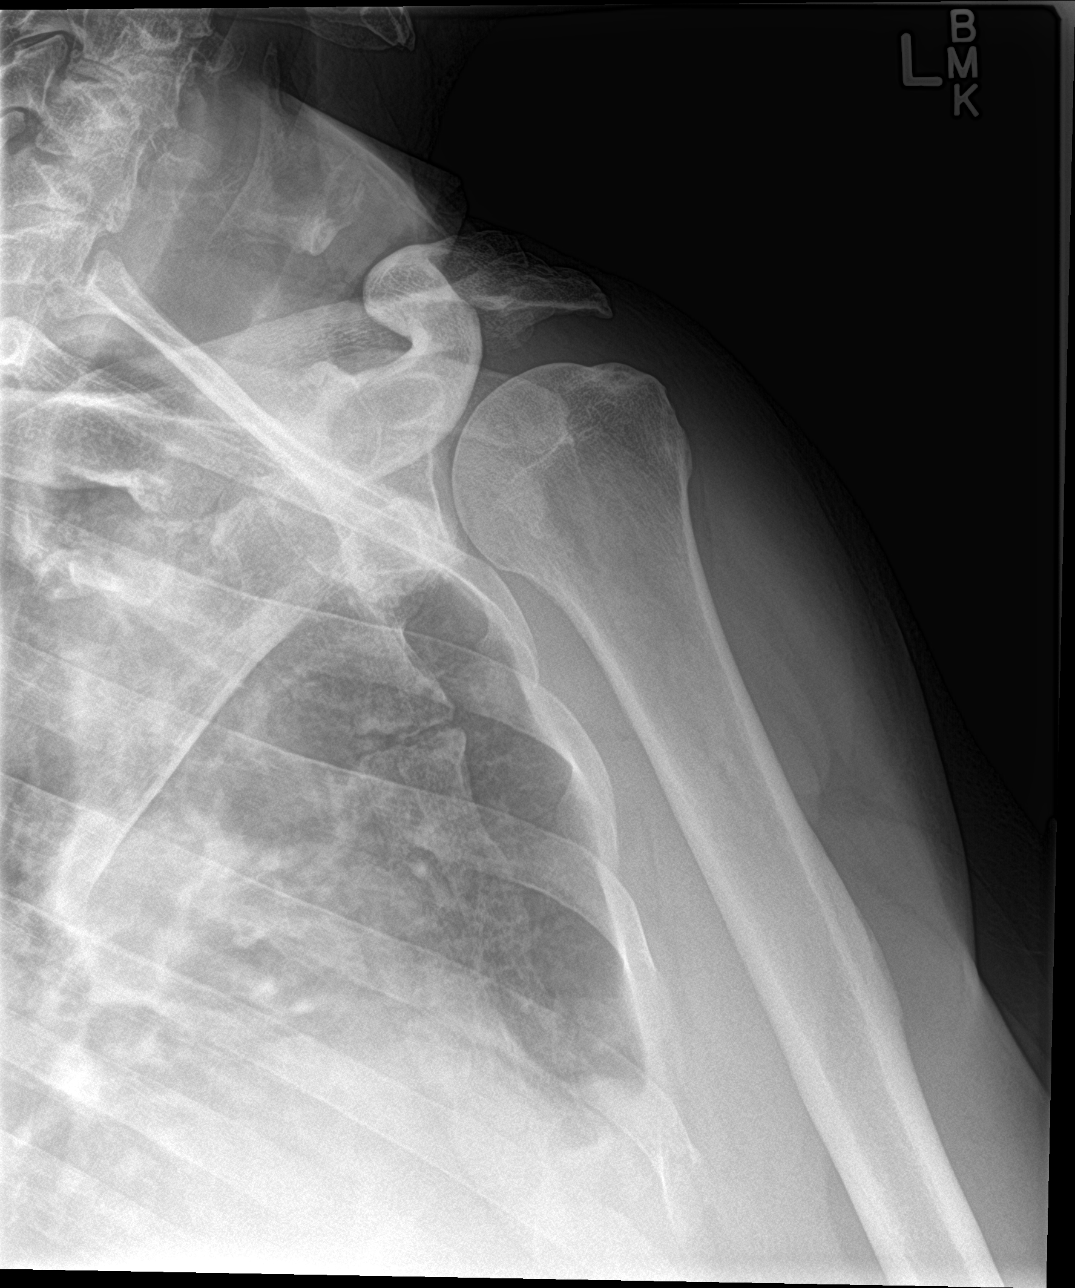
[im 2/2]
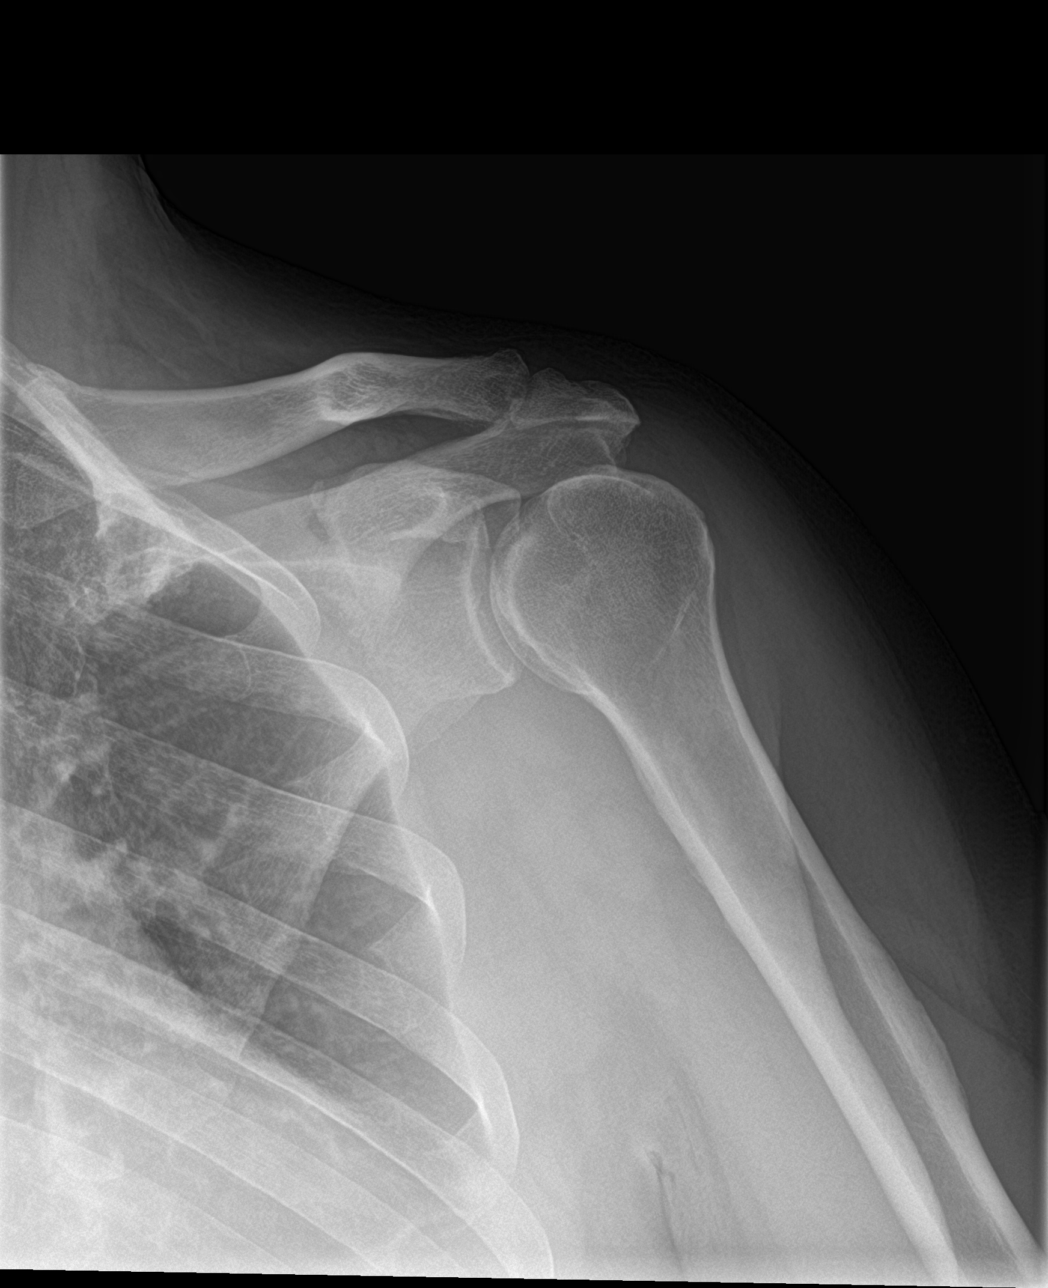

[shoulder y view]
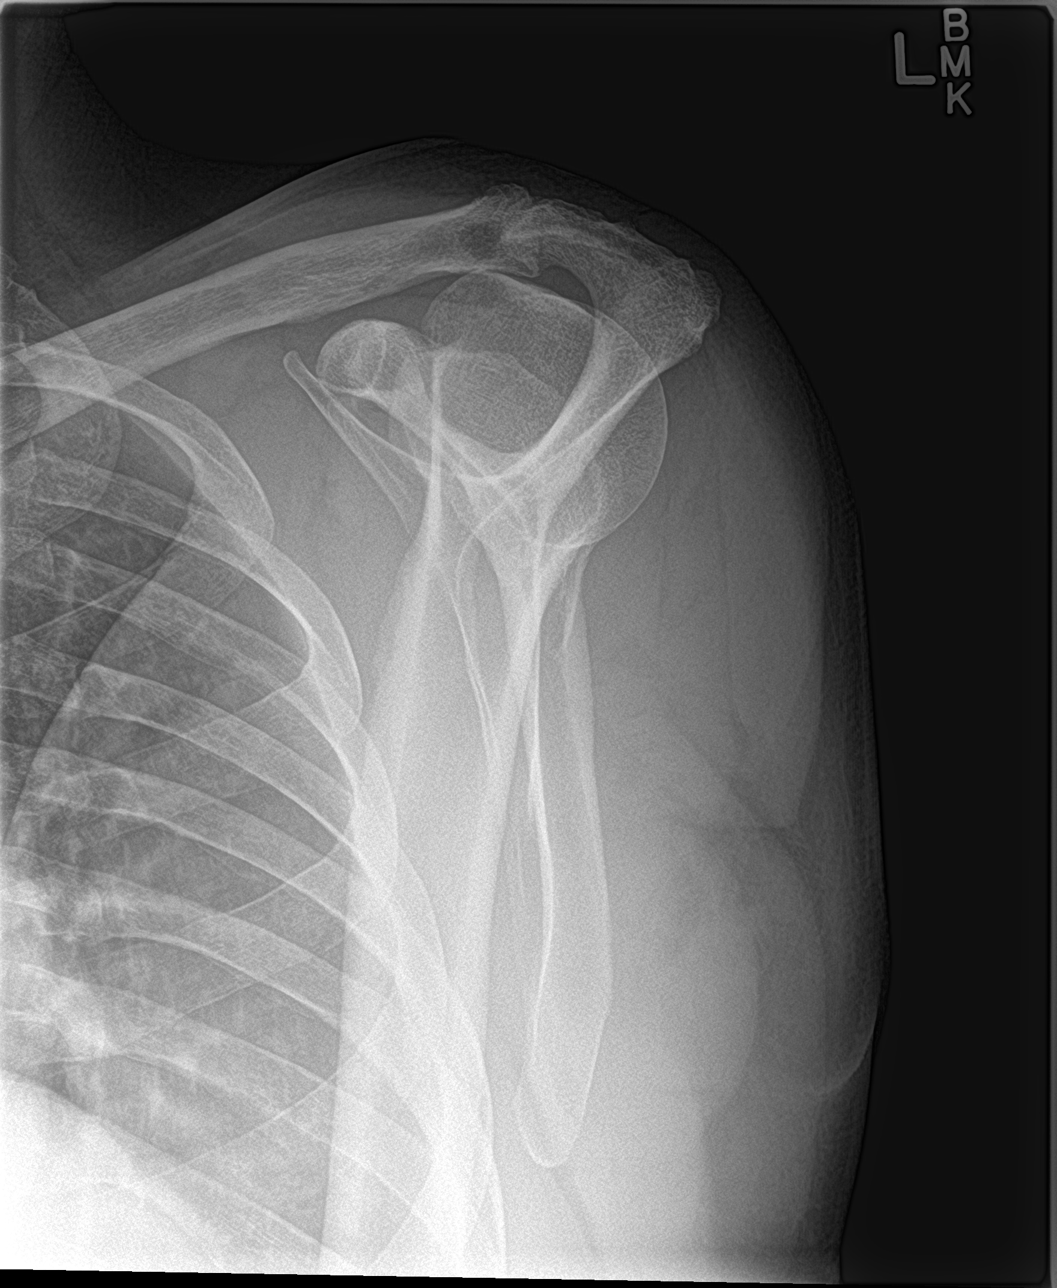

[shoulder axillary]
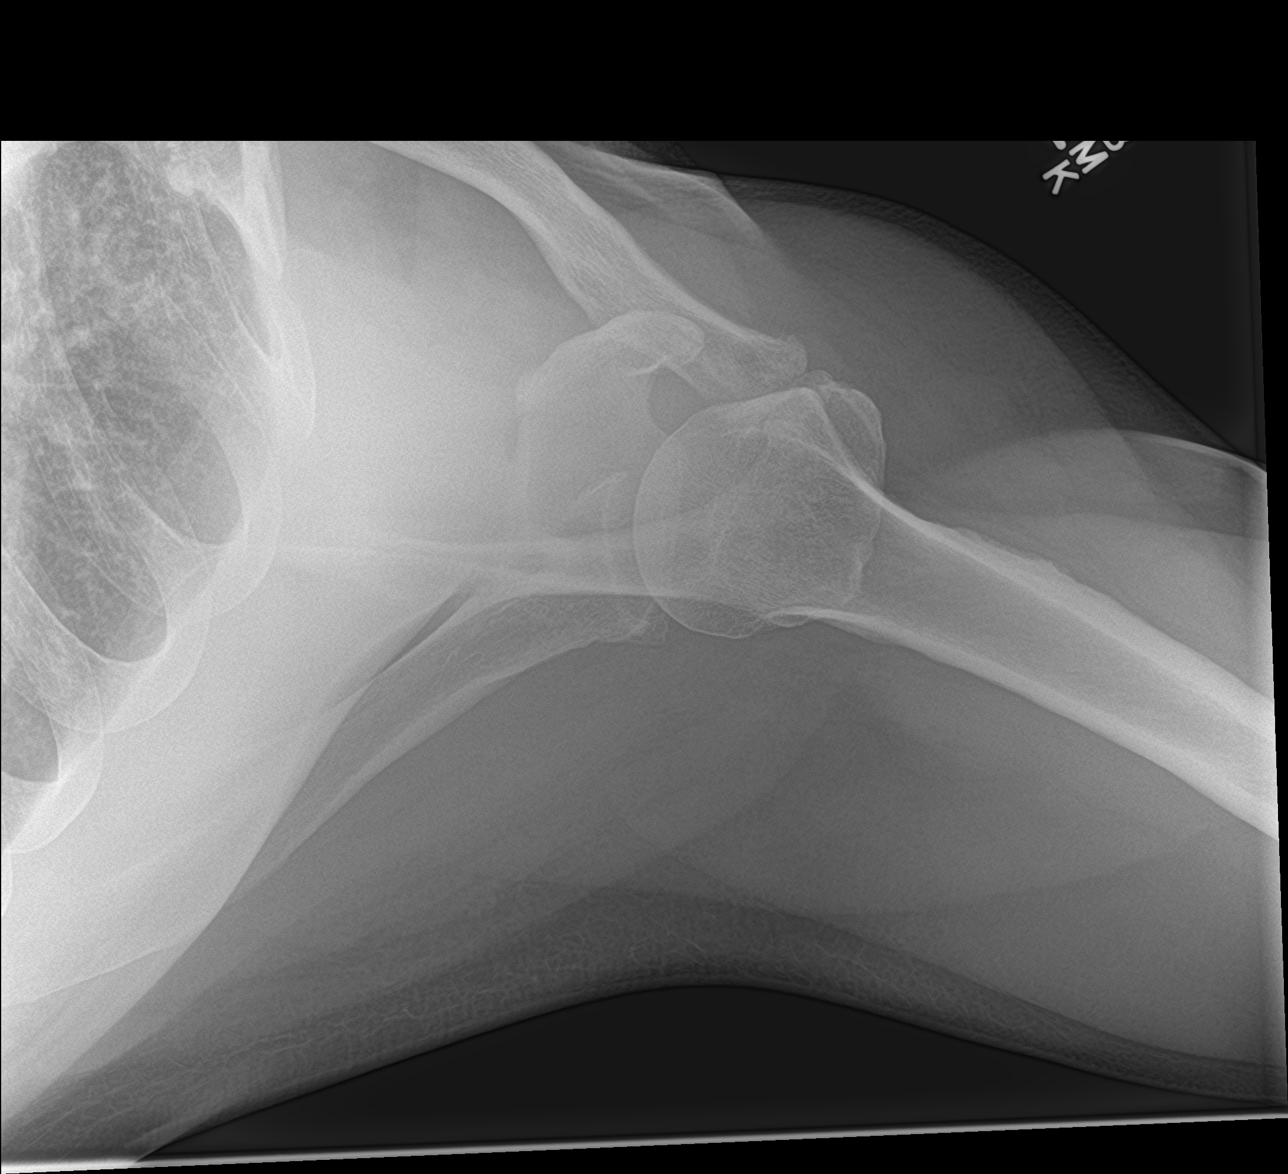

[4 of 4 positions shown; findings below may reference images not displayed]

FINDINGS: There appears to be a fracture lucency at the base of the coracoid.
The glenohumeral and AC joints are maintained. The adjacent ribs and
lung are unremarkable.
IMPRESSION: Lucency at the base of the coracoid seen only on one view. Findings
are suspicious for nondisplaced fracture.

## 2017-11-25 ENCOUNTER — Ambulatory Visit: Payer: Self-pay | Admitting: Urology

## 2017-11-26 ENCOUNTER — Ambulatory Visit (INDEPENDENT_AMBULATORY_CARE_PROVIDER_SITE_OTHER): Payer: Medicaid Other | Admitting: Urology

## 2017-11-26 ENCOUNTER — Encounter: Payer: Self-pay | Admitting: Urology

## 2017-11-26 VITALS — BP 128/88 | HR 80 | Ht 68.0 in | Wt 281.0 lb

## 2017-11-26 DIAGNOSIS — E291 Testicular hypofunction: Secondary | ICD-10-CM

## 2017-11-26 DIAGNOSIS — N5201 Erectile dysfunction due to arterial insufficiency: Secondary | ICD-10-CM | POA: Diagnosis not present

## 2017-11-26 MED ORDER — CLOMIPHENE CITRATE 50 MG PO TABS: 25.0000 mg | ORAL_TABLET | Freq: Every day | ORAL | 1 refills | Status: DC

## 2017-11-26 MED ORDER — SILDENAFIL CITRATE 20 MG PO TABS: ORAL_TABLET | ORAL | 0 refills | Status: DC

## 2017-11-26 NOTE — Progress Notes (Signed)
11/26/2017 3:47 PM   Bryan Whitney 06/23/1960 [id number]  Referring provider: No referring provider defined for this encounter.  Chief Complaint  Patient presents with  . Medication Refill    HPI: 57 year old male previously followed by me at [facility] for hypogonadism and erectile dysfunction.  He was on Clomid and sildenafil with good efficacy.  His symptoms included tiredness, fatigue, decreased libido and erectile dysfunction.  He ran out of medication approximately 3 months ago and presents today for a refill.  He has no voiding complaints.  Denies dysuria or gross hematuria.  Denies flank, abdominal, pelvic or scrotal pain.   PMH: Past Medical History:  Diagnosis Date  . Coronary artery disease    Inferior ST elevation myocardial infarction in July 2017. Cardiac catheterization showed occluded proximal right coronary artery with 40% disease in proximal LAD and OM1. Successful angioplasty and drug-eluting stent placement to the right coronary artery. Ejection fraction was normal.  . Hyperlipidemia   . Hypertension   . Tobacco use     Surgical History: Past Surgical History:  Procedure Laterality Date  . CARDIAC CATHETERIZATION N/A 09/23/2015   Procedure: Left Heart Cath and Coronary Angiography;  Surgeon: Iran Ouch, MD;  [facility]: [facility];  Service: Cardiovascular;  Laterality: N/A;  . CARDIAC CATHETERIZATION N/A 09/23/2015   Procedure: Coronary Stent Intervention;  Surgeon: Iran Ouch, MD;  [facility]: [facility];  Service: Cardiovascular;  Laterality: N/A;  . HERNIA REPAIR      Home Medications:  Allergies as of 11/26/2017   No Known Allergies     Medication List        Accurate as of 11/26/17  3:47 PM. Always use your most recent med list.          amLODipine 5 MG tablet Commonly known as:  NORVASC Take 1 tablet (5 mg total) by mouth daily.   aspirin EC 81 MG tablet Take 1 tablet (81 mg total) by mouth daily.   atorvastatin 80 MG  tablet Commonly known as:  LIPITOR Take 1 tablet (80 mg total) by mouth daily.   carvedilol 6.25 MG tablet Commonly known as:  COREG Take 1 tablet (6.25 mg total) by mouth 2 (two) times daily.   clomiPHENE 50 MG tablet Commonly known as:  CLOMID Take 0.5 tablets (25 mg total) by mouth daily.   lisinopril 40 MG tablet Commonly known as:  PRINIVIL,ZESTRIL Take 1 tablet (40 mg total) by mouth daily.   metFORMIN 500 MG tablet Commonly known as:  GLUCOPHAGE TAKE 1 TABLET BY MOUTH TWICE A DAY FOR DIABETES   nitroGLYCERIN 0.4 MG SL tablet Commonly known as:  NITROSTAT Place 1 tablet (0.4 mg total) under the tongue every 5 (five) minutes as needed.       Allergies: No Known Allergies  Family History: No family history on file.  Social History:  reports that he has been smoking cigarettes and e-cigarettes. He has a 50.00 pack-year smoking history. He has never used smokeless tobacco. He reports that he drinks about 1.0 standard drinks of alcohol per week. He reports that he does not use drugs.  ROS: UROLOGY Frequent Urination?: No Hard to postpone urination?: No Burning/pain with urination?: No Get up at night to urinate?: No Leakage of urine?: No Urine stream starts and stops?: No Trouble starting stream?: No Do you have to strain to urinate?: No Blood in urine?: No Urinary tract infection?: No Sexually transmitted disease?: No Injury to kidneys or bladder?: No Painful  intercourse?: No Weak stream?: No Erection problems?: Yes Penile pain?: No  Gastrointestinal Nausea?: No Vomiting?: No Indigestion/heartburn?: No Diarrhea?: No Constipation?: No  Constitutional Fever: No Night sweats?: No Weight loss?: No Fatigue?: Yes  Skin Skin rash/lesions?: No Itching?: No  Eyes Blurred vision?: No Double vision?: No  Ears/Nose/Throat Sore throat?: No Sinus problems?: No  Hematologic/Lymphatic Swollen glands?: No Easy bruising?: No  Cardiovascular Leg  swelling?: No Chest pain?: No  Respiratory Cough?: No Shortness of breath?: No  Endocrine Excessive thirst?: No  Musculoskeletal Back pain?: No Joint pain?: No  Neurological Headaches?: No Dizziness?: No  Psychologic Depression?: No Anxiety?: No  Physical Exam: BP 128/88 (BP Location: Left Arm, Patient Position: Sitting, Cuff Size: Large)   Pulse 80   Ht 5\' 8"  (1.727 m)   Wt 281 lb (127.5 kg)   BMI 42.73 kg/m   Constitutional:  Alert and oriented, No acute distress. HEENT: [place] AT, moist mucus membranes.  Trachea midline, no masses. Cardiovascular: No clubbing, cyanosis, or edema. Respiratory: Normal respiratory effort, no increased work of breathing. GI: Abdomen is soft, nontender, nondistended, no abdominal masses GU: No CVA tenderness Lymph: No cervical or inguinal lymphadenopathy. Skin: No rashes, bruises or suspicious lesions. Neurologic: Grossly intact, no focal deficits, moving all 4 extremities. Psychiatric: Normal mood and affect.   Assessment & Plan:   57 year old male with hypogonadism and erectile dysfunction.  Prescriptions for Clomid and sildenafil were sent to his pharmacy.  Since he has been off treatment for 3 months will hold off on blood work today and he will return in 3 months for a lab visit for a testosterone, hematocrit and PSA.  He has sublingual NTG which he has not taken in years.  He knows not to use if he has had sildenafil within 24 hours.    Riki AltesScott C Burma Ketcher, MD  [facility] [address], [address] [place], [place] [address] [phone]

## 2017-11-28 ENCOUNTER — Encounter: Payer: Self-pay | Admitting: Urology

## 2017-12-10 ENCOUNTER — Encounter: Payer: Self-pay | Admitting: Cardiovascular Disease

## 2017-12-10 ENCOUNTER — Ambulatory Visit (INDEPENDENT_AMBULATORY_CARE_PROVIDER_SITE_OTHER): Payer: Medicaid Other | Admitting: Cardiovascular Disease

## 2017-12-10 VITALS — BP 140/90 | HR 58 | Ht 68.0 in | Wt 231.5 lb

## 2017-12-10 DIAGNOSIS — I10 Essential (primary) hypertension: Secondary | ICD-10-CM | POA: Diagnosis not present

## 2017-12-10 DIAGNOSIS — Z720 Tobacco use: Secondary | ICD-10-CM

## 2017-12-10 DIAGNOSIS — E785 Hyperlipidemia, unspecified: Secondary | ICD-10-CM

## 2017-12-10 DIAGNOSIS — I2510 Atherosclerotic heart disease of native coronary artery without angina pectoris: Secondary | ICD-10-CM

## 2017-12-10 MED ORDER — ATORVASTATIN CALCIUM 80 MG PO TABS: 80.0000 mg | ORAL_TABLET | Freq: Every day | ORAL | 1 refills | Status: DC

## 2017-12-10 NOTE — Progress Notes (Signed)
Cardiology Office Note   Date:  12/10/2017   ID:  Bryan Whitney, DOB Jan 16, 1961, MRN [id number]  PCP:  Patient, No Pcp Per  Cardiologist:   Lorine Bears, MD   Chief Complaint  Patient presents with  . other    6 month follow up. Meds reviewed by the pt. verbally. Pt. c/o elevated blood pressure.       History of Present Illness: Bryan Whitney is a 56 y.o. male who presents for a follow-up visit regarding coronary artery disease. He had inferior ST elevation myocardial infarction in July 2017.Cardiac catheterization  showed occluded proximal right coronary artery with mild disease affecting the LAD and OM1. He underwent successful angioplasty and drug-eluting stent placement to the right coronary artery without complications. Ejection fraction was normal. He stopped smoking but continues to vape.  He has been doing well with no chest pain or shortness of breath.  No palpitations.  He had a treadmill stress test in April of this year which showed no evidence of ischemia.  He exercised for almost 6 minutes and had hypertensive response to exercise with frequent PVCs.  He was started on carvedilol 6.25 mg twice daily after that.  He had an echocardiogram done in June which showed normal ejection fraction with no significant valvular abnormalities.   Past Medical History:  Diagnosis Date  . Coronary artery disease    Inferior ST elevation myocardial infarction in July 2017. Cardiac catheterization showed occluded proximal right coronary artery with 40% disease in proximal LAD and OM1. Successful angioplasty and drug-eluting stent placement to the right coronary artery. Ejection fraction was normal.  . Hyperlipidemia   . Hypertension   . Tobacco use     Past Surgical History:  Procedure Laterality Date  . CARDIAC CATHETERIZATION N/A 09/23/2015   Procedure: Left Heart Cath and Coronary Angiography;  Surgeon: Iran Ouch, MD;  [facility];  Service:  Cardiovascular;  Laterality: N/A;  . CARDIAC CATHETERIZATION N/A 09/23/2015   Procedure: Coronary Stent Intervention;  Surgeon: Iran Ouch, MD;  Location: [facility];  Service: Cardiovascular;  Laterality: N/A;  . HERNIA REPAIR       Current Outpatient Medications  Medication Sig Dispense Refill  . amLODipine (NORVASC) 5 MG tablet Take 1 tablet (5 mg total) by mouth daily. 30 tablet 1  . aspirin EC 81 MG tablet Take 1 tablet (81 mg total) by mouth daily. 90 tablet 3  . atorvastatin (LIPITOR) 80 MG tablet Take 1 tablet (80 mg total) by mouth daily. 90 tablet 3  . carvedilol (COREG) 6.25 MG tablet Take 1 tablet (6.25 mg total) by mouth 2 (two) times daily. 60 tablet 5  . clomiPHENE (CLOMID) 50 MG tablet Take 0.5 tablets (25 mg total) by mouth daily. 30 tablet 1  . lisinopril (PRINIVIL,ZESTRIL) 40 MG tablet Take 1 tablet (40 mg total) by mouth daily. 90 tablet 3  . metFORMIN (GLUCOPHAGE) 500 MG tablet TAKE 1 TABLET BY MOUTH TWICE A DAY FOR DIABETES  6  . nitroGLYCERIN (NITROSTAT) 0.4 MG SL tablet Place 1 tablet (0.4 mg total) under the tongue every 5 (five) minutes as needed. 25 tablet 3  . sildenafil (REVATIO) 20 MG tablet 2-5 tabs 1 hour prior to intercourse 90 tablet 0   No current facility-administered medications for this visit.     Allergies:   Patient has no known allergies.    Social History:  The patient  reports that he quit smoking about a year  ago. His smoking use included cigarettes and e-cigarettes. He has a 50.00 pack-year smoking history. He has never used smokeless tobacco. He reports that he drinks about 1.0 standard drinks of alcohol per week. He reports that he does not use drugs.   Family History:  The patient's family history is negative for coronary artery disease.   ROS:  Please see the history of present illness.   Otherwise, review of systems are positive for none.   All other systems are reviewed and negative.    PHYSICAL EXAM: VS:  BP 140/90 (BP  Location: Left Arm, Patient Position: Sitting, Cuff Size: Normal)   Pulse (!) 58   Ht 5\' 8"  (1.727 m)   Wt 231 lb 8 oz (105 kg)   BMI 35.20 kg/m  , BMI Body mass index is 35.2 kg/m. GEN: Well nourished, well developed, in no acute distress  HEENT: normal  Neck: no JVD, carotid bruits, or masses Cardiac: RRR; no murmurs, rubs, or gallops,no edema  Respiratory:  clear to auscultation bilaterally, normal work of breathing GI: soft, nontender, nondistended, + BS MS: no deformity or atrophy  Skin: warm and dry, no rash Neuro:  Strength and sensation are intact Psych: euthymic mood, full affect   EKG:  EKG is ordered today. EKG shows normal sinus bradycardia with no significant ST or T wave changes.   Recent Labs: No results found for requested labs within last 8760 hours.    Lipid Panel    Component Value Date/Time   CHOL 107 11/06/2015 0950   TRIG 53 11/06/2015 0950   HDL 34 (L) 11/06/2015 0950   CHOLHDL 3.1 11/06/2015 0950   CHOLHDL 6.0 09/24/2015 0405   VLDL 22 09/24/2015 0405   LDLCALC 62 11/06/2015 0950      Wt Readings from Last 3 Encounters:  12/10/17 231 lb 8 oz (105 kg)  11/26/17 281 lb (127.5 kg)  04/02/17 231 lb (104.8 kg)         ASSESSMENT AND PLAN:  1.  Coronary artery disease involving native coronary arteries without angina:   Continue aspirin indefinitely and treatment of risk factors.  2. Essential hypertension:  Blood pressure improved from before but still not controlled.  He has not been taking his medications regularly as he gets confused and he does not seem to be organized.  I advised him to start using a pillbox.  If blood pressure continues to be elevated, we can consider a thiazide diuretic.  3. Hyperlipidemia: Continue treatment with atorvastatin.  Most recent LDL was 62.  He gets his labs done at [facility].  4. Tobacco use: He is using electronic cigarettes. I discussed with him the importance of complete cessation of  nicotine products.    Disposition:   FU with me in 6 months  Signed,  Lorine Bears, MD  12/10/2017 9:19 AM    [facility]

## 2017-12-10 NOTE — Patient Instructions (Signed)
Medication Instructions: Your physician recommends that you continue on your current medications as directed. Please refer to the Current Medication list given to you today.  If you need a refill on your cardiac medications before your next appointment, please call your pharmacy.   Follow-Up: Your physician wants you to follow-up in 6 months with Dr. Arida. You will receive a reminder letter in the mail two months in advance. If you don't receive a letter, please call our office at [phone] to schedule this follow-up appointment.  Thank you for choosing [facility]!    

## 2017-12-14 ENCOUNTER — Telehealth: Payer: Self-pay

## 2017-12-14 NOTE — Telephone Encounter (Signed)
Received prior authorization for Sildenafil and Clomid. I called pharmacy, they state it was not needed. Pt has picked up Clomid, but did not pick up Sildenafil.

## 2018-01-19 ENCOUNTER — Other Ambulatory Visit: Payer: Self-pay | Admitting: Cardiovascular Disease

## 2018-01-25 ENCOUNTER — Other Ambulatory Visit: Payer: Self-pay

## 2018-01-25 NOTE — Telephone Encounter (Signed)
ERROR

## 2018-01-27 ENCOUNTER — Other Ambulatory Visit: Payer: Self-pay

## 2018-01-27 MED ORDER — CLOMIPHENE CITRATE 50 MG PO TABS: 25.0000 mg | ORAL_TABLET | Freq: Every day | ORAL | 3 refills | Status: DC

## 2018-02-25 ENCOUNTER — Other Ambulatory Visit: Payer: Medicaid Other

## 2018-02-25 DIAGNOSIS — E291 Testicular hypofunction: Secondary | ICD-10-CM

## 2018-02-26 LAB — PSA: Prostate Specific Ag, Serum: 5.5 ng/mL — ABNORMAL HIGH (ref 0.0–4.0)

## 2018-02-26 LAB — HEMATOCRIT: HEMATOCRIT: 44.5 % (ref 37.5–51.0)

## 2018-02-26 LAB — TESTOSTERONE: Testosterone: 454 ng/dL (ref 264–916)

## 2018-03-01 ENCOUNTER — Telehealth: Payer: Self-pay

## 2018-03-01 DIAGNOSIS — R972 Elevated prostate specific antigen [PSA]: Secondary | ICD-10-CM

## 2018-03-01 NOTE — Telephone Encounter (Signed)
Patient notified and scheduled for lab drop off this week order placed

## 2018-03-01 NOTE — Telephone Encounter (Signed)
-----   Message from Riki AltesScott C Stoioff, MD sent at 02/28/2018 11:56 AM EST ----- Hematocrit was normal.  Testosterone level looks good at 454.  His PSA was elevated at 5.5.  Recommend checking a urinalysis

## 2018-03-04 ENCOUNTER — Encounter: Payer: Self-pay | Admitting: Urology

## 2018-03-04 ENCOUNTER — Other Ambulatory Visit: Payer: Self-pay

## 2018-03-05 ENCOUNTER — Other Ambulatory Visit: Payer: Medicaid Other

## 2018-03-05 DIAGNOSIS — R972 Elevated prostate specific antigen [PSA]: Secondary | ICD-10-CM

## 2018-03-05 LAB — URINALYSIS, COMPLETE
Bilirubin, UA: NEGATIVE
Ketones, UA: NEGATIVE
Leukocytes, UA: NEGATIVE
NITRITE UA: NEGATIVE
Protein, UA: NEGATIVE
Specific Gravity, UA: >1.03 — ABNORMAL HIGH (ref 1.005–1.030)
Urobilinogen, Ur: 0.2 mg/dL (ref 0.2–1.0)
pH, UA: 6 (ref 5.0–7.5)

## 2018-03-05 LAB — MICROSCOPIC EXAMINATION
Epithelial Cells (non renal): NONE SEEN /hpf (ref 0–10)
RBC MICROSCOPIC, UA: NONE SEEN /HPF (ref 0–2)

## 2018-03-11 ENCOUNTER — Telehealth: Payer: Self-pay | Admitting: Urology

## 2018-03-11 NOTE — Telephone Encounter (Signed)
Pt called asking about his U/A culture results.  He is also out of RX and would like it sent to [facility] in [place].  (Clomid & Sildenafil) Pt would like to get a 90 day supply, if possible.

## 2018-03-11 NOTE — Telephone Encounter (Signed)
Left message to call office

## 2018-03-15 MED ORDER — SILDENAFIL CITRATE 20 MG PO TABS: ORAL_TABLET | ORAL | 0 refills | Status: DC

## 2018-03-15 MED ORDER — CLOMIPHENE CITRATE 50 MG PO TABS: 25.0000 mg | ORAL_TABLET | Freq: Every day | ORAL | 2 refills | Status: AC

## 2018-03-15 NOTE — Telephone Encounter (Signed)
Patient returned call.  He can be reached at [phone].  He is also still inquiring about his prescriptions.

## 2018-03-15 NOTE — Telephone Encounter (Signed)
Called pt because [facility] was [facility] to [facility] on [facility]/27/2018 for #90 with #3 RF. Per pt he no longer wants to get rx from there, that it is cheaper at [facility]. Resent rx.   Sent in Sildenafil, drug to drug showed up to sign for refill, as noted in Stoioff most recent note 11/26/2017 pt is aware to not take NTG if he has taken sidenafil within 24 hours.   Nothing further is needed

## 2018-03-25 ENCOUNTER — Telehealth: Payer: Self-pay | Admitting: Cardiovascular Disease

## 2018-03-25 NOTE — Telephone Encounter (Signed)
I called and spoke with the patient. The patient thought Dr. Kirke Corin stopped a medication at his last office visit. I advised the patient that when Dr. Kirke Corin saw him in 11/2017 that no medication changes were made at that time.  I did review all medications that we have on file in Epic are what the patient is currently taking. The patient does confirm this.   All questions were answered.

## 2018-03-25 NOTE — Telephone Encounter (Signed)
Please call to review medication list. Pt is not sure what he is to be taking.

## 2018-05-11 ENCOUNTER — Other Ambulatory Visit: Payer: Self-pay

## 2018-05-11 MED ORDER — AMLODIPINE BESYLATE 5 MG PO TABS: 5.0000 mg | ORAL_TABLET | Freq: Every day | ORAL | 1 refills | Status: AC

## 2018-05-11 MED ORDER — LISINOPRIL 40 MG PO TABS: 40.0000 mg | ORAL_TABLET | Freq: Every day | ORAL | 1 refills | Status: AC

## 2018-05-11 MED ORDER — ATORVASTATIN CALCIUM 80 MG PO TABS: 80.0000 mg | ORAL_TABLET | Freq: Every day | ORAL | 1 refills | Status: AC

## 2018-05-11 MED ORDER — CARVEDILOL 6.25 MG PO TABS: 6.2500 mg | ORAL_TABLET | Freq: Two times a day (BID) | ORAL | 1 refills | Status: AC

## 2018-05-11 NOTE — Telephone Encounter (Signed)
Pt would like heart medications refilled, but is not sure what the names are.  [facility] 30 day supply

## 2018-07-22 ENCOUNTER — Telehealth: Payer: Medicaid Other | Admitting: Cardiovascular Disease

## 2019-08-24 ENCOUNTER — Encounter: Payer: Self-pay | Admitting: Nurse Practitioner

## 2019-08-24 ENCOUNTER — Ambulatory Visit: Payer: Medicaid Other | Admitting: Nurse Practitioner

## 2019-08-24 NOTE — Progress Notes (Deleted)
Office Visit    Patient Name: Bryan Whitney Date of Encounter: 08/24/2019  Primary Care Provider:  Patient, No Pcp Per Primary Cardiologist:  Kathlyn Sacramento, MD  Chief Complaint    59 year old male with a history of coronary artery disease status post inferior MI in July 2017, hypertension, hyperlipidemia, and tobacco abuse, who presents for follow-up of CAD.  Past Medical History    Past Medical History:  Diagnosis Date  . Coronary artery disease    09/2015 Inf STEMI/PCI: LM nl, LAD 40p, D1 60, D2 min irregs, LCX min irregs, OM1 40, OM2 min irregs, RCA 161m (2.75x38 Xience Alpine DES), EF 55-60%; b. 08/2017 ETT: Ex 5:50. Freq PVCs. BP 246/92. No ST/T changes.  . Diastolic dysfunction    a. 09/2015 Echo: EF 60-65%, mod LVH, mild basal-midinf HK. Gr1 DD; b. 08/2017 Echo: EF 55-60%, mild LVH. Gr1 DD, Mildly dil Asc Ao. Mild MR. Nl RV fxn. Mildly dil RA.  Marland Kitchen Hyperlipidemia   . Hypertension   . Tobacco use    Past Surgical History:  Procedure Laterality Date  . CARDIAC CATHETERIZATION N/A 09/23/2015   Procedure: Left Heart Cath and Coronary Angiography;  Surgeon: Wellington Hampshire, MD;  Location: [facility];  Service: Cardiovascular;  Laterality: N/A;  . CARDIAC CATHETERIZATION N/A 09/23/2015   Procedure: Coronary Stent Intervention;  Surgeon: Wellington Hampshire, MD;  Location: [facility];  Service: Cardiovascular;  Laterality: N/A;  . HERNIA REPAIR      Allergies  No Known Allergies  History of Present Illness    59 year old male with the above past medical history including coronary artery disease, hypertension, hyperlipidemia, and tobacco abuse.  In July 2017, he suffered an inferior ST segment elevation myocardial infarction with diagnostic catheterization revealing an occluded proximal right coronary artery with mild disease affecting the LAD and first obtuse marginal.  He underwent successful PCI drug-eluting stent placement to the RCA without complications.  EF was  normal.  In April 2019, he underwent exercise treadmill testing, walking for nearly 6 minutes.  He had a hypertensive response to exercise was noted to have frequent PVCs.  He was subsequently placed on carvedilol therapy.  His most recent echocardiogram was performed in June 2019 showing normal LV function without significant valvular abnormalities.  Mr. Gerding was last seen in clinic in September 2019 at which time he was feeling well but was hypertensive.  There was question about compliance.  Home Medications    Prior to Admission medications   Medication Sig Start Date End Date Taking? Authorizing Provider  amLODipine (NORVASC) 5 MG tablet Take 1 tablet (5 mg total) by mouth daily. 05/11/18 08/09/18  Wellington Hampshire, MD  aspirin EC 81 MG tablet Take 1 tablet (81 mg total) by mouth daily. 04/02/17   Wellington Hampshire, MD  atorvastatin (LIPITOR) 80 MG tablet Take 1 tablet (80 mg total) by mouth daily. 05/11/18   Wellington Hampshire, MD  carvedilol (COREG) 6.25 MG tablet Take 1 tablet (6.25 mg total) by mouth 2 (two) times daily. 05/11/18   Wellington Hampshire, MD  clomiPHENE (CLOMID) 50 MG tablet Take 0.5 tablets (25 mg total) by mouth daily. 03/15/18   Stoioff, Ronda Fairly, MD  lisinopril (PRINIVIL,ZESTRIL) 40 MG tablet Take 1 tablet (40 mg total) by mouth daily. 05/11/18   Wellington Hampshire, MD  metFORMIN (GLUCOPHAGE) 500 MG tablet TAKE 1 TABLET BY MOUTH TWICE A DAY FOR DIABETES 10/05/17   [provider]  nitroGLYCERIN (  NITROSTAT) 0.4 MG SL tablet Place 1 tablet (0.4 mg total) under the tongue every 5 (five) minutes as needed. 04/02/17   Iran Ouch, MD  sildenafil (REVATIO) 20 MG tablet 2-5 tabs 1 hour prior to intercourse 03/15/18   Riki Altes, MD    Review of Systems    ***.  All other systems reviewed and are otherwise negative except as noted above.  Physical Exam    VS:  There were no vitals taken for this visit. , BMI There is no height or weight on file to calculate  BMI. GEN: Well nourished, well developed, in no acute distress. HEENT: normal. Neck: Supple, no JVD, carotid bruits, or masses. Cardiac: RRR, no murmurs, rubs, or gallops. No clubbing, cyanosis, edema.  Radials/DP/PT 2+ and equal bilaterally.  Respiratory:  Respirations regular and unlabored, clear to auscultation bilaterally. GI: Soft, nontender, nondistended, BS + x 4. MS: no deformity or atrophy. Skin: warm and dry, no rash. Neuro:  Strength and sensation are intact. Psych: Normal affect.  Accessory Clinical Findings    ECG personally reviewed by me today - *** - no acute changes.  Lab Results  Component Value Date   WBC 11.5 (H) 09/23/2015   HGB 14.2 09/23/2015   HCT 44.5 02/25/2018   MCV 86.8 09/23/2015   PLT 214 09/23/2015   Lab Results  Component Value Date   CREATININE 1.11 09/24/2015   BUN 10 09/24/2015   NA 140 09/24/2015   K 3.7 09/24/2015   CL 108 09/24/2015   CO2 29 09/24/2015   Lab Results  Component Value Date   ALT 11 11/06/2015   AST 14 11/06/2015   ALKPHOS 73 11/06/2015   BILITOT 0.5 11/06/2015   Lab Results  Component Value Date   CHOL 107 11/06/2015   HDL 34 (L) 11/06/2015   LDLCALC 62 11/06/2015   TRIG 53 11/06/2015   CHOLHDL 3.1 11/06/2015    Lab Results  Component Value Date   HGBA1C 6.3 (H) 09/23/2015    Assessment & Plan    1.  ***   Nicolasa Ducking, NP 08/24/2019, 8:59 AM

## 2019-08-25 ENCOUNTER — Encounter: Payer: Self-pay | Admitting: Nurse Practitioner

## 2019-09-14 ENCOUNTER — Ambulatory Visit: Payer: Medicaid Other | Admitting: Urology

## 2019-09-30 ENCOUNTER — Ambulatory Visit: Payer: Self-pay | Admitting: Urology

## 2019-10-08 NOTE — Progress Notes (Signed)
10/10/2019 1:34 PM   Bryan Whitney 1961-01-03 [id number]  Referring provider: No referring provider defined for this encounter. Chief Complaint  Patient presents with  . Erectile Dysfunction    HPI: Bryan Whitney is a 59 y.o. male with hypogonadism ad erectile dysfunction who presents today for a routine follow up.   -Previously followed by me at [facility] for hypogonadism and erectile dysfunction. -He was on Clomid and sildenafil with good efficacy.   -Most recent PSA was 5.5 on 02/25/2018.  -Patient would like to restart Clomid and sildenafil.  -He stopped both medications in 2019 secondary to Covid. -Recurrent tiredness, fatigue and worsening ED   PMH: Past Medical History:  Diagnosis Date  . Coronary artery disease    09/2015 Inf STEMI/PCI: LM nl, LAD 40p, D1 60, D2 min irregs, LCX min irregs, OM1 40, OM2 min irregs, RCA 147m (2.75x38 Xience Alpine DES), EF 55-60%; b. 08/2017 ETT: Ex 5:50. Freq PVCs. BP 246/92. No ST/T changes.  . Diastolic dysfunction    a. 09/2015 Echo: EF 60-65%, mod LVH, mild basal-midinf HK. Gr1 DD; b. 08/2017 Echo: EF 55-60%, mild LVH. Gr1 DD, Mildly dil Asc Ao. Mild MR. Nl RV fxn. Mildly dil RA.  Marland Kitchen Hyperlipidemia   . Hypertension   . Tobacco use     Surgical History: Past Surgical History:  Procedure Laterality Date  . CARDIAC CATHETERIZATION N/A 09/23/2015   Procedure: Left Heart Cath and Coronary Angiography;  Surgeon: Iran Ouch, MD;  Location: [facility];  Service: Cardiovascular;  Laterality: N/A;  . CARDIAC CATHETERIZATION N/A 09/23/2015   Procedure: Coronary Stent Intervention;  Surgeon: Iran Ouch, MD;  Location: [facility];  Service: Cardiovascular;  Laterality: N/A;  . HERNIA REPAIR      Home Medications:  Allergies as of 10/10/2019   No Known Allergies     Medication List       Accurate as of October 10, 2019  1:34 PM. If you have any questions, ask your nurse or doctor.        amLODipine 5 MG  tablet Commonly known as: NORVASC Take 1 tablet (5 mg total) by mouth daily.   aspirin EC 81 MG tablet Take 1 tablet (81 mg total) by mouth daily.   atorvastatin 80 MG tablet Commonly known as: LIPITOR Take 1 tablet (80 mg total) by mouth daily.   carvedilol 6.25 MG tablet Commonly known as: COREG Take 1 tablet (6.25 mg total) by mouth 2 (two) times daily.   clomiPHENE 50 MG tablet Commonly known as: CLOMID Take 0.5 tablets (25 mg total) by mouth daily.   lisinopril 40 MG tablet Commonly known as: ZESTRIL Take 1 tablet (40 mg total) by mouth daily.   metFORMIN 500 MG tablet Commonly known as: GLUCOPHAGE TAKE 1 TABLET BY MOUTH TWICE A DAY FOR DIABETES   nitroGLYCERIN 0.4 MG SL tablet Commonly known as: Nitrostat Place 1 tablet (0.4 mg total) under the tongue every 5 (five) minutes as needed.   sildenafil 20 MG tablet Commonly known as: REVATIO 2-5 tabs 1 hour prior to intercourse       Allergies: No Known Allergies  Family History: No family history on file.  Social History:  reports that he quit smoking about 2 years ago. His smoking use included cigarettes and e-cigarettes. He has a 50.00 pack-year smoking history. He has never used smokeless tobacco. He reports current alcohol use of about 1.0 standard drink of alcohol per week. He reports that he does  not use drugs.   Physical Exam: BP (!) 132/75   Pulse 102   Ht 5\' 8"  (1.727 m)   Wt (!) 226 lb (102.5 kg)   BMI 34.36 kg/m   Constitutional:  Alert and oriented, No acute distress. HEENT: [place] AT, moist mucus membranes.  Trachea midline, no masses. Cardiovascular: No clubbing, cyanosis, or edema. Respiratory: Normal respiratory effort, no increased work of breathing. Skin: No rashes, bruises or suspicious lesions. Neurologic: Grossly intact, no focal deficits, moving all 4 extremities. Psychiatric: Normal mood and affect.    Assessment & Plan:    1. Hypogonadism -Recheck testosterone today -Will hold  off on Clomid until PSA rechecked -Will call with results.  2. Erectile dysfunction -Rx sildenafil sent  3.  Elevated PSA -Prior PSA 2018 1.71 -Recheck PSA today   [facility] [address] [place], [place] [phone]  I, [phone], am acting as a scribe for Dr. Theador Hawthorne C. Telena Peyser,  I have reviewed the above documentation for accuracy and completeness, and I agree with the above.   Lorin Picket, MD

## 2019-10-10 ENCOUNTER — Other Ambulatory Visit: Payer: Self-pay

## 2019-10-10 ENCOUNTER — Encounter: Payer: Self-pay | Admitting: Urology

## 2019-10-10 ENCOUNTER — Ambulatory Visit (INDEPENDENT_AMBULATORY_CARE_PROVIDER_SITE_OTHER): Payer: Self-pay | Admitting: Urology

## 2019-10-10 VITALS — BP 132/75 | HR 102 | Ht 68.0 in | Wt 226.0 lb

## 2019-10-10 DIAGNOSIS — R972 Elevated prostate specific antigen [PSA]: Secondary | ICD-10-CM

## 2019-10-10 DIAGNOSIS — E291 Testicular hypofunction: Secondary | ICD-10-CM

## 2019-10-10 DIAGNOSIS — N5201 Erectile dysfunction due to arterial insufficiency: Secondary | ICD-10-CM

## 2019-10-10 MED ORDER — SILDENAFIL CITRATE 20 MG PO TABS: ORAL_TABLET | ORAL | 0 refills | Status: AC

## 2019-10-11 ENCOUNTER — Telehealth: Payer: Self-pay | Admitting: Urology

## 2019-10-11 LAB — TESTOSTERONE: Testosterone: 179 ng/dL — ABNORMAL LOW (ref 264–916)

## 2019-10-11 LAB — PSA: Prostate Specific Ag, Serum: 12.9 ng/mL — ABNORMAL HIGH (ref 0.0–4.0)

## 2019-10-11 NOTE — Telephone Encounter (Signed)
Notified patient as instructed, patient  Is trying to find out if he has insurances. He will call us back to schedule.

## 2019-10-11 NOTE — Telephone Encounter (Signed)
PSA was elevated at 12.9 which is significantly higher than 5.5 in 2019.  Recommend scheduling prostate biopsy.    Testosterone level was low at 179.  Will not be able to start Clomid for his low testosterone until the elevated PSA is addressed

## 2019-10-13 NOTE — Telephone Encounter (Signed)
Spoke with patient and he is checking with family planning medicaid to see if the prostate biopsy will be covered. Pt is also asking what would be the out of pocket cost for a prostate biopsy? Please advise

## 2019-10-13 NOTE — Telephone Encounter (Signed)
We do not accept [employer] family planning

## 2019-10-14 NOTE — Telephone Encounter (Signed)
Spoke with patient and scheduled a telephone visit, due to all the questions he has. Pt is waiting to hear from his caseworker .

## 2019-10-14 NOTE — Telephone Encounter (Signed)
After verbally speaking with Bryan Whitney, she has advised that pt needs to contact his caseworker to see if prostate biopsy will be covered.   .left message to have patient return my call.

## 2019-10-19 ENCOUNTER — Other Ambulatory Visit: Payer: Self-pay

## 2019-10-19 ENCOUNTER — Encounter: Payer: Self-pay | Admitting: Urology

## 2019-10-19 ENCOUNTER — Telehealth (INDEPENDENT_AMBULATORY_CARE_PROVIDER_SITE_OTHER): Payer: Medicaid Other | Admitting: Urology

## 2019-10-19 DIAGNOSIS — R972 Elevated prostate specific antigen [PSA]: Secondary | ICD-10-CM

## 2019-10-19 MED ORDER — TAMSULOSIN HCL 0.4 MG PO CAPS
0.4000 mg | ORAL_CAPSULE | Freq: Every day | ORAL | 0 refills | Status: AC
Start: 2019-10-19 — End: ?

## 2019-10-19 MED ORDER — SILDENAFIL CITRATE 100 MG PO TABS
ORAL_TABLET | ORAL | 0 refills | Status: AC
Start: 2019-10-19 — End: ?

## 2019-10-21 ENCOUNTER — Encounter: Payer: Self-pay | Admitting: Urology

## 2019-10-21 NOTE — Progress Notes (Signed)
Virtual Visit via Telephone Note  I connected with Bryan Whitney on 10/21/19 at  3:30 PM EDT by telephone and verified that I am speaking with the correct person using two identifiers.  Location: Patient: Home Provider: [facility]   I discussed the limitations, risks, security and privacy concerns of performing an evaluation and management service by telephone and the availability of in person appointments. I also discussed with the patient that there may be a patient responsible charge related to this service. The patient expressed understanding and agreed to proceed.   History of Present Illness: Refer to my previous note 10/10/2019.  PSA in December 2019 was slightly elevated at 5.5 and was repeated which had increased to 12.9.  I recommended a prostate biopsy and he requested a telephone appointment because he had additional questions.  He states he currently does not have insurance and wanted to delay biopsy for a few months until he has insurance coverage.   Observations/Objective: N/A  Assessment and Plan:  1.  Elevated PSA I did not recommend delaying biopsy more than 4-6 weeks.  We did discuss this is an office-based procedure and will see if we can get him some information regarding cost.  I did send in Rx tamsulosin and will repeat his PSA in 4 weeks.  Follow Up Instructions: Repeat PSA 4 weeks   I discussed the assessment and treatment plan with the patient. The patient was provided an opportunity to ask questions and all were answered. The patient agreed with the plan and demonstrated an understanding of the instructions.   The patient was advised to call back or seek an in-person evaluation if the symptoms worsen or if the condition fails to improve as anticipated.  I provided 15 minutes of non-face-to-face time during this encounter.   Riki Altes, MD

## 2019-11-30 ENCOUNTER — Telehealth: Payer: Self-pay | Admitting: Urology

## 2019-11-30 NOTE — Telephone Encounter (Signed)
Pt called to cancel his lab appt, did not wish to r/s lab appt Pt also asked to have Dr. Lonna Cobb send in a refill of a medication that was prescribed a few years back, pt can not remember the name, said it was for his testicles and helps build testosterone naturally. Please advise.

## 2019-12-01 ENCOUNTER — Encounter: Payer: Self-pay | Admitting: Urology

## 2019-12-01 NOTE — Telephone Encounter (Signed)
Will do!

## 2019-12-01 NOTE — Telephone Encounter (Signed)
With an elevated PSA and the possibility of prostate cancer cannot prescribe any treatment for low testosterone.

## 2019-12-05 ENCOUNTER — Other Ambulatory Visit: Payer: Medicaid Other

## 2020-02-02 ENCOUNTER — Encounter: Payer: Self-pay | Admitting: Urology

## 2020-03-01 ENCOUNTER — Telehealth: Payer: Self-pay | Admitting: Cardiovascular Disease

## 2020-03-01 NOTE — Telephone Encounter (Signed)
3 attempts to schedule fu appt from recall list.   Deleting recall.   

## 2022-04-30 ENCOUNTER — Emergency Department: Payer: Self-pay

## 2022-04-30 ENCOUNTER — Encounter: Payer: Self-pay | Admitting: Emergency Medicine

## 2022-04-30 ENCOUNTER — Emergency Department
Admission: EM | Admit: 2022-04-30 | Discharge: 2022-04-30 | Discharge status: Against Medical Advice | Payer: Self-pay | Attending: Emergency Medicine | Admitting: Emergency Medicine

## 2022-04-30 DIAGNOSIS — F17200 Nicotine dependence, unspecified, uncomplicated: Secondary | ICD-10-CM | POA: Insufficient documentation

## 2022-04-30 DIAGNOSIS — I2693 Single subsegmental pulmonary embolism without acute cor pulmonale: Secondary | ICD-10-CM | POA: Insufficient documentation

## 2022-04-30 DIAGNOSIS — R7309 Other abnormal glucose: Secondary | ICD-10-CM | POA: Insufficient documentation

## 2022-04-30 LAB — CBC
HCT: 47.4 % (ref 39.0–52.0)
Hemoglobin: 16.3 g/dL (ref 13.0–17.0)
MCH: 28.7 pg (ref 26.0–34.0)
MCHC: 34.4 g/dL (ref 30.0–36.0)
MCV: 83.6 fL (ref 80.0–100.0)
Platelets: 242 10*3/uL (ref 150–400)
RBC: 5.67 MIL/uL (ref 4.22–5.81)
RDW: 12.6 % (ref 11.5–15.5)
WBC: 10.2 10*3/uL (ref 4.0–10.5)
nRBC: 0 % (ref 0.0–0.2)

## 2022-04-30 LAB — BASIC METABOLIC PANEL
Anion gap: 11 (ref 5–15)
BUN: 12 mg/dL (ref 8–23)
CO2: 25 mmol/L (ref 22–32)
Calcium: 8.8 mg/dL — ABNORMAL LOW (ref 8.9–10.3)
Chloride: 100 mmol/L (ref 98–111)
Creatinine, Ser: 0.93 mg/dL (ref 0.61–1.24)
GFR, Estimated: >60 mL/min (ref >60)
Glucose, Bld: 477 mg/dL — ABNORMAL HIGH (ref 70–99)
Potassium: 4.2 mmol/L (ref 3.5–5.1)
Sodium: 136 mmol/L (ref 135–145)

## 2022-04-30 LAB — TROPONIN I (HIGH SENSITIVITY)
Troponin I (High Sensitivity): 7 ng/L (ref <18)
Troponin I (High Sensitivity): 6 ng/L (ref <18)

## 2022-04-30 MED ORDER — IOHEXOL 350 MG/ML SOLN
75.0000 mL | Freq: Once | INTRAVENOUS | Status: AC | PRN
  Administered 2022-04-30: 75 mL via INTRAVENOUS

## 2022-04-30 MED ORDER — APIXABAN (ELIQUIS) EDUCATION KIT FOR DVT/PE PATIENTS: PACK | Freq: Once | Status: AC

## 2022-04-30 MED ORDER — EMPAGLIFLOZIN 10 MG PO TABS: 10.0000 mg | ORAL_TABLET | Freq: Every day | ORAL | 0 refills | Status: AC

## 2022-04-30 MED ORDER — APIXABAN 5 MG PO TABS
10.0000 mg | ORAL_TABLET | Freq: Once | ORAL | Status: AC
  Administered 2022-04-30: 10 mg via ORAL
  Filled 2022-04-30: qty 2

## 2022-04-30 MED ORDER — APIXABAN (ELIQUIS) VTE STARTER PACK (10MG AND 5MG): ORAL_TABLET | ORAL | 0 refills | Status: AC

## 2022-04-30 NOTE — ED Triage Notes (Signed)
Pt reports left sided CP x5 days. Hx of stent 4-5 years ago. Pain is very mild at this time. Nausea a few days ago but none at this time.

## 2022-04-30 NOTE — Discharge Instructions (Signed)
START THE BLOOD THINNER as prescribed. You were given a dose tonight. Take the next dose in the early morning (around 6 AM).  Try to move as much as possible with frequent walking when driving long distances.  Stopping smoking will put you at a much lower risk of blood clots.  DO NOT take aspirin or other blood thinners like advil, motrin when on the Eliquis

## 2022-04-30 NOTE — ED Provider Notes (Signed)
[facility] Provider Note    Event Date/Time   First MD Initiated Contact with Patient 04/30/22 1610     (approximate)   History   Chest Pain   HPI  Bryan Whitney is a 62 y.o. male 62 year old male here with left-sided chest pain, mild pleurisy.  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  He states that over the last few days, he has had fairly constant left-sided chest pain.  The pain has been somewhat aching, throbbing, and constant.  It is slightly worse with deep inspiration.  No shortness of breath.  No hemoptysis.  No lower extremity swelling.  Of note, he does not take any of his previously prescribed medications.  He states that he believes these cause more issues than they prevent.  No other complaints.     Physical Exam   Triage Vital Signs: ED Triage Vitals  Enc Vitals Group     BP 04/30/22 1542 (!) 160/86     Pulse Rate 04/30/22 1542 99     Resp 04/30/22 1542 18     Temp 04/30/22 1542 97.8 F (36.6 C)     Temp Source 04/30/22 1542 Oral     SpO2 04/30/22 1542 96 %     Weight 04/30/22 1543 224 lb 13.9 oz (102 kg)     Height 04/30/22 1543 5' 8"$  (1.727 m)     Head Circumference --      Peak Flow --      Pain Score 04/30/22 1542 2     Pain Loc --      Pain Edu? --      Excl. in [place]? --     Most recent vital signs: Vitals:   04/30/22 1542  BP: (!) 160/86  Pulse: 99  Resp: 18  Temp: 97.8 F (36.6 C)  SpO2: 96%     General: Awake, no distress.  CV:  Good peripheral perfusion. RRR. No murmurs or rubs. Resp:  Normal effort. Lungs clear bilaterally. Abd:  No distention. No tenderness. Other:  Slight asymmetric edema of LLE>RLE which pt states is chronic due to old injury.   ED Results / Procedures / Treatments   Labs (all labs ordered are listed, but only abnormal results are displayed) Labs Reviewed  BASIC METABOLIC PANEL - Abnormal; Notable for the following components:      Result Value    Glucose, Bld 477 (*)    Calcium 8.8 (*)    All other components within normal limits  CBC  TROPONIN I (HIGH SENSITIVITY)  TROPONIN I (HIGH SENSITIVITY)     EKG Sinus tachycardia, ventricular 102.  PR 150, QRS 80, QTc 440.  No acute ST elevations or depression.  No acute evidence of acute ischemic infarct.   RADIOLOGY Chest x-ray: Chronic bronchitic markings, no acute findings CT angio chest: Small PE in the upper lobe of the right pulmonary artery   I also independently reviewed and agree with radiologist interpretations.   PROCEDURES:  Critical Care performed: No   MEDICATIONS ORDERED IN ED: Medications  iohexol (OMNIPAQUE) 350 MG/ML injection 75 mL (75 mLs Intravenous Contrast Given 04/30/22 1723)  apixaban (ELIQUIS) Education Kit for DVT/PE patients ( Does not apply Given 04/30/22 1809)  apixaban (ELIQUIS) tablet 10 mg (10 mg Oral Given 04/30/22 1809)     IMPRESSION / MDM / ASSESSMENT AND PLAN / ED COURSE  I reviewed the triage vital signs and the nursing notes.  Differential diagnosis includes, but is not limited to, ACS, atypical chest pain, GERD, gastritis, PE, pneumonia, atelectasis  Patient's presentation is most consistent with acute presentation with potential threat to life or bodily function.  The patient is on the cardiac monitor to evaluate for evidence of arrhythmia and/or significant heart rate changes.  62 year old male here with left-sided chest pain.  Do not suspect ACS as EKG is nonischemic and troponins are negative x 2 with fairly constant symptoms.  Patient lab work overall reassuring.  No leukocytosis or anemia.  BMP is unremarkable.  Given his smoking, sedentary lifestyle, and pleurisy, CT angio obtained and does show small PE.  He is not hypoxic.  I discussed and recommended admission with the patient.  He states he has to go to work and refuses admission at this time.  He understands that this places him at increased  risk of worsening PE, cardiac arrest, death.  Will start him on Eliquis and I provided him with a 1 month supply for this.  Of note, glucose also greater than 400.  He was previously on Jardiance for this.  He is reluctant to take metformin or any other medications but will refill his Jardiance for the month.  I discussed that it is very important that he follows up with the PCP for both restratification as well as refills and further workup for his PE.     FINAL CLINICAL IMPRESSION(S) / ED DIAGNOSES   Final diagnoses:  Single subsegmental pulmonary embolism without acute cor pulmonale (HCC)     Rx / DC Orders   ED Discharge Orders          Ordered    APIXABAN (ELIQUIS) VTE STARTER PACK (10MG AND 5MG)        04/30/22 1753    empagliflozin (JARDIANCE) 10 MG TABS tablet  Daily before breakfast        04/30/22 1758             Note:  This document was prepared using Dragon voice recognition software and may include unintentional dictation errors.   Duffy Bruce, MD 04/30/22 [phone]

## 2022-04-30 NOTE — ED Notes (Signed)
Pt educated on risks associated with leaving against medical advice. Provider also provided education. Pt stated he still wanted to leave
